# Patient Record
Sex: Female | Born: 1945
Health system: Southern US, Community
[De-identification: ages and names within clinical notes are randomized; demographics above are authoritative.]

## PROBLEM LIST (undated history)

## (undated) DIAGNOSIS — E785 Hyperlipidemia, unspecified: Secondary | ICD-10-CM

## (undated) DIAGNOSIS — B019 Varicella without complication: Secondary | ICD-10-CM

## (undated) DIAGNOSIS — N952 Postmenopausal atrophic vaginitis: Secondary | ICD-10-CM

## (undated) HISTORY — DX: Postmenopausal atrophic vaginitis: N95.2

## (undated) HISTORY — DX: Hyperlipidemia, unspecified: E78.5

## (undated) HISTORY — DX: Varicella without complication: B01.9

## (undated) HISTORY — PX: HYSTEROSCOPY WITH D & C: SHX1775

## (undated) HISTORY — PX: HAND SURGERY: SHX662

## (undated) HISTORY — PX: OTHER SURGICAL HISTORY: SHX169

## (undated) HISTORY — PX: AUGMENTATION MAMMAPLASTY: SUR837

## (undated) HISTORY — PX: BRAIN SURGERY: SHX531

## (undated) HISTORY — PX: EYE SURGERY: SHX253

---
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
## 2002-05-26 HISTORY — PX: AUGMENTATION MAMMAPLASTY: SUR837

## 2006-11-19 ENCOUNTER — Emergency Department: Payer: Self-pay | Admitting: Emergency Medicine

## 2007-01-07 ENCOUNTER — Ambulatory Visit: Payer: Self-pay | Admitting: Obstetrics & Gynecology

## 2008-02-18 ENCOUNTER — Ambulatory Visit: Payer: Self-pay | Admitting: Ophthalmology

## 2008-02-28 ENCOUNTER — Ambulatory Visit: Payer: Self-pay | Admitting: Ophthalmology

## 2008-05-08 ENCOUNTER — Ambulatory Visit: Payer: Self-pay | Admitting: Ophthalmology

## 2010-06-07 ENCOUNTER — Ambulatory Visit: Payer: Self-pay | Admitting: Internal Medicine

## 2011-06-09 ENCOUNTER — Ambulatory Visit: Payer: Self-pay | Admitting: Internal Medicine

## 2011-08-01 ENCOUNTER — Ambulatory Visit: Payer: Self-pay

## 2011-09-19 ENCOUNTER — Ambulatory Visit: Payer: Self-pay | Admitting: Unknown Physician Specialty

## 2011-10-17 ENCOUNTER — Ambulatory Visit: Payer: Self-pay | Admitting: Unknown Physician Specialty

## 2011-11-18 ENCOUNTER — Ambulatory Visit: Payer: Self-pay

## 2012-11-15 DIAGNOSIS — N952 Postmenopausal atrophic vaginitis: Secondary | ICD-10-CM

## 2012-11-15 HISTORY — DX: Postmenopausal atrophic vaginitis: N95.2

## 2013-07-21 ENCOUNTER — Ambulatory Visit: Payer: Self-pay | Admitting: Podiatrist

## 2013-07-25 ENCOUNTER — Ambulatory Visit (INDEPENDENT_AMBULATORY_CARE_PROVIDER_SITE_OTHER): Payer: Medicare Other

## 2013-07-25 ENCOUNTER — Ambulatory Visit (INDEPENDENT_AMBULATORY_CARE_PROVIDER_SITE_OTHER): Payer: Medicare Other | Admitting: Podiatry

## 2013-07-25 ENCOUNTER — Encounter: Payer: Self-pay | Admitting: Podiatry

## 2013-07-25 VITALS — BP 171/98 | HR 69 | Resp 16 | Ht 66.0 in | Wt 160.0 lb

## 2013-07-25 DIAGNOSIS — M79609 Pain in unspecified limb: Secondary | ICD-10-CM

## 2013-07-25 DIAGNOSIS — L02619 Cutaneous abscess of unspecified foot: Secondary | ICD-10-CM

## 2013-07-25 DIAGNOSIS — M79676 Pain in unspecified toe(s): Secondary | ICD-10-CM

## 2013-07-25 DIAGNOSIS — L03039 Cellulitis of unspecified toe: Secondary | ICD-10-CM

## 2013-07-25 DIAGNOSIS — M21619 Bunion of unspecified foot: Secondary | ICD-10-CM

## 2013-07-25 DIAGNOSIS — L600 Ingrowing nail: Secondary | ICD-10-CM

## 2013-07-25 MED ORDER — NEOMYCIN-POLYMYXIN-HC 3.5-10000-1 OT SOLN: OTIC | Status: DC

## 2013-07-25 MED ORDER — LEVOFLOXACIN 750 MG PO TABS: 750.0000 mg | ORAL_TABLET | Freq: Every day | ORAL | Status: DC

## 2013-07-25 NOTE — Progress Notes (Signed)
   Subjective:    Patient ID: Lynn Cochran, female    DOB: [birth year], 68 y.o.   MRN: [id number]  HPI Comments: Right great toe is sore and red, its been like this since the beginning of feb. Went to [facility] and they put me on doxcycline. Soaking in epsom salt , it has not changed at all      Review of Systems  All other systems reviewed and are negative.       Objective:   Physical Exam I have reviewed her past medical history medications allergies surgeries social history and review of systems. She has a Immunologist. She works for [employer] at [employer]. Pulses are strongly palpable bilateral. Neurologic sensorium is intact. He tendon reflexes are intact bilateral. Muscle strength is 5 over 5 dorsiflexors plantar flexors inverters everters all intrinsic musculature is intact. Orthopedic evaluation demonstrates mild hallux abductovalgus deformities bilateral. Flexible asymptomatic hammertoe deformities are also noted. Cutaneous evaluation demonstrates supple well hydrated cutis with exception of a proximal medial nail fold of the right hallux the cuticle is missing to the nail plate. Radiographic evaluation does not demonstrate any type of osseous abnormalities in this area.        Assessment & Plan:  Assessment: Paronychia medial hallux right. Hallux abductovalgus deformity bilateral. Flexible hammertoe deformities bilateral.  Plan: Discussed etiology pathology conservative versus surgical therapies at this point we performed a partial temporary nail avulsion with I&D of the abscess today she tolerated this procedure well after local anesthetic I also wrote her prescription for Levaquin 750 mg 1 a day and I will followup with her in one week. She will also start soaking in Epsom salts warm water daily

## 2013-07-25 NOTE — Patient Instructions (Signed)

## 2013-08-01 ENCOUNTER — Ambulatory Visit: Payer: No Typology Code available for payment source | Admitting: Podiatry

## 2013-08-03 ENCOUNTER — Ambulatory Visit (INDEPENDENT_AMBULATORY_CARE_PROVIDER_SITE_OTHER): Payer: Medicare Other | Admitting: Podiatry

## 2013-08-03 VITALS — BP 169/93 | HR 69 | Resp 16 | Ht 66.0 in | Wt 160.0 lb

## 2013-08-03 DIAGNOSIS — Z9889 Other specified postprocedural states: Secondary | ICD-10-CM

## 2013-08-03 NOTE — Progress Notes (Signed)
She presents today for followup of a matrixectomy and I in the tibial border proximal aspect of the hallux right. She states that she's been soaking in Epsom salts warm water and taking her Levaquin daily. The toe really hasn't hurt that much but it doesn't really look any better to her.  Objective: Vital signs are stable she is alert and oriented x3 pulses are palpable to the right foot. She does have erythema to the proximal portion of the tibial nail fold the surgical site appears to be healing quite nicely however she does have that proximal erythema that very well may not be infectious. He may be inflammation. Currently there is no drainage no purulence and malodor.  Assessment: Proximal erythema tibial border hallux right with little change that she's been taking antibiotics starting Saturday.  Plan: Continue soaking continue antibiotics. Next visit we will consider blood work, looking for an elevated sedimentation rate for possible osteomyelitis. And may consider putting her on Diflucan. Another set of x-rays may be taken.

## 2013-08-04 NOTE — Addendum Note (Signed)
Addended by: Tyson Dense T on: 08/04/2013 12:43 PM   Modules accepted: Level of Service

## 2013-08-17 ENCOUNTER — Ambulatory Visit (INDEPENDENT_AMBULATORY_CARE_PROVIDER_SITE_OTHER): Payer: Medicare Other

## 2013-08-17 ENCOUNTER — Ambulatory Visit (INDEPENDENT_AMBULATORY_CARE_PROVIDER_SITE_OTHER): Payer: Medicare Other | Admitting: Podiatry

## 2013-08-17 VITALS — BP 75/50 | HR 72 | Resp 16

## 2013-08-17 DIAGNOSIS — M79673 Pain in unspecified foot: Secondary | ICD-10-CM

## 2013-08-17 DIAGNOSIS — M79609 Pain in unspecified limb: Secondary | ICD-10-CM

## 2013-08-17 DIAGNOSIS — L02619 Cutaneous abscess of unspecified foot: Secondary | ICD-10-CM

## 2013-08-17 DIAGNOSIS — L03039 Cellulitis of unspecified toe: Secondary | ICD-10-CM

## 2013-08-17 MED ORDER — FLUCONAZOLE 150 MG PO TABS: ORAL_TABLET | ORAL | Status: DC

## 2013-08-17 NOTE — Progress Notes (Signed)
She presents today for followup of erythema to the proximal medial nail fold of the right hallux. She states it really don't see getting any better. She's been soaking in Epsom salts and water twice daily. She has finished up her Levaquin.  Objective: Vital signs are stable she is alert and oriented x3. Persistent erythema to the proximal medial nail fold of the right hallux. Which appears to be healing after matrixectomy. Radiographs taken today do not demonstrate any sign of osseous infection.  Assessment: Possible mycotic or yeast infection to the proximal medial nail fold.  Plan: Diclofenac 150 mg 1 by mouth daily x2 weeks. I will followup with her in 3 weeks to assess change.

## 2013-08-29 ENCOUNTER — Telehealth: Payer: Self-pay | Admitting: Podiatry

## 2013-08-29 NOTE — Telephone Encounter (Signed)
PT CALLED AND WOULD LIKE UPDATE ON INSURANCE ISSUES AND THE PAYMENTS SHE NEEDS TO MAKE

## 2013-09-07 ENCOUNTER — Ambulatory Visit (INDEPENDENT_AMBULATORY_CARE_PROVIDER_SITE_OTHER): Payer: Medicare Other | Admitting: Podiatry

## 2013-09-07 VITALS — Resp 16 | Ht 66.0 in | Wt 160.0 lb

## 2013-09-07 DIAGNOSIS — L03039 Cellulitis of unspecified toe: Principal | ICD-10-CM

## 2013-09-07 DIAGNOSIS — L02619 Cutaneous abscess of unspecified foot: Secondary | ICD-10-CM

## 2013-09-07 NOTE — Progress Notes (Signed)
She states something has been going on with his hallux right since February and it seems to be getting a little better since she started a Diflucan. She states the Achilles tendon longer hurts.  Objective: Pulses are palpable right. Hallux appears to be much decrease in edema and erythema since the last visit.  Assessment slowly healing cellulitis right foot.  Plan: Continue Diflucan for another month if this does not resolve then I would suggest a bone scan.

## 2013-09-28 ENCOUNTER — Ambulatory Visit (INDEPENDENT_AMBULATORY_CARE_PROVIDER_SITE_OTHER): Payer: Medicare Other | Admitting: Podiatry

## 2013-09-28 VITALS — BP 127/78 | HR 64 | Resp 16

## 2013-09-28 DIAGNOSIS — L03039 Cellulitis of unspecified toe: Principal | ICD-10-CM

## 2013-09-28 DIAGNOSIS — L02619 Cutaneous abscess of unspecified foot: Secondary | ICD-10-CM

## 2013-09-28 NOTE — Progress Notes (Signed)
She presents today for followup with her redness to her hallux right. She states it is no longer painful is not swollen anymore but is still tender she finished up her Diflucan.  Objective: Vital signs are stable she is alert and oriented x3. Pulses are palpable right. Mild erythema along the tibial border proximally but there is no raised area and it is not painful on palpation as it was previously.  Assessment: Well-healing paronychia hallux right.  Plan: Followup with me as needed.

## 2014-09-25 DIAGNOSIS — H6980 Other specified disorders of Eustachian tube, unspecified ear: Secondary | ICD-10-CM | POA: Diagnosis not present

## 2014-09-25 DIAGNOSIS — J301 Allergic rhinitis due to pollen: Secondary | ICD-10-CM | POA: Diagnosis not present

## 2014-09-25 DIAGNOSIS — H9042 Sensorineural hearing loss, unilateral, left ear, with unrestricted hearing on the contralateral side: Secondary | ICD-10-CM | POA: Diagnosis not present

## 2014-09-25 DIAGNOSIS — H9312 Tinnitus, left ear: Secondary | ICD-10-CM | POA: Diagnosis not present

## 2014-10-17 DIAGNOSIS — H2513 Age-related nuclear cataract, bilateral: Secondary | ICD-10-CM | POA: Diagnosis not present

## 2014-10-17 DIAGNOSIS — H5203 Hypermetropia, bilateral: Secondary | ICD-10-CM | POA: Diagnosis not present

## 2014-10-17 DIAGNOSIS — H524 Presbyopia: Secondary | ICD-10-CM | POA: Diagnosis not present

## 2014-10-17 DIAGNOSIS — H52223 Regular astigmatism, bilateral: Secondary | ICD-10-CM | POA: Diagnosis not present

## 2014-10-30 LAB — HM MAMMOGRAPHY: HM MAMMO: NEGATIVE

## 2014-11-20 LAB — HM PAP SMEAR: HM PAP: NORMAL

## 2015-03-02 ENCOUNTER — Ambulatory Visit (INDEPENDENT_AMBULATORY_CARE_PROVIDER_SITE_OTHER): Payer: Medicare Other | Admitting: Nurse Practitioner

## 2015-03-02 ENCOUNTER — Encounter: Payer: Self-pay | Admitting: Nurse Practitioner

## 2015-03-02 VITALS — BP 122/70 | HR 59 | Temp 98.2°F | Resp 14 | Ht 66.0 in | Wt 173.2 lb

## 2015-03-02 DIAGNOSIS — Z1239 Encounter for other screening for malignant neoplasm of breast: Secondary | ICD-10-CM | POA: Insufficient documentation

## 2015-03-02 DIAGNOSIS — Z23 Encounter for immunization: Secondary | ICD-10-CM

## 2015-03-02 DIAGNOSIS — Z7189 Other specified counseling: Secondary | ICD-10-CM

## 2015-03-02 DIAGNOSIS — E785 Hyperlipidemia, unspecified: Secondary | ICD-10-CM | POA: Diagnosis not present

## 2015-03-02 DIAGNOSIS — E663 Overweight: Secondary | ICD-10-CM

## 2015-03-02 DIAGNOSIS — Z7689 Persons encountering health services in other specified circumstances: Secondary | ICD-10-CM | POA: Insufficient documentation

## 2015-03-02 LAB — LIPID PANEL
CHOL/HDL RATIO: 5
Cholesterol: 258 mg/dL — ABNORMAL HIGH (ref 0–200)
HDL: 54.2 mg/dL (ref >39.00)
LDL Cholesterol: 166 mg/dL — ABNORMAL HIGH (ref 0–99)
NONHDL: 203.32
Triglycerides: 186 mg/dL — ABNORMAL HIGH (ref 0.0–149.0)
VLDL: 37.2 mg/dL (ref 0.0–40.0)

## 2015-03-02 LAB — HEMOGLOBIN A1C: HEMOGLOBIN A1C: 5.3 % (ref 4.6–6.5)

## 2015-03-02 NOTE — Assessment & Plan Note (Signed)
Placed order for mammogram today

## 2015-03-02 NOTE — Assessment & Plan Note (Addendum)
Discussed acute and chronic issues. Reviewed health maintenance measures, PFSHx, and immunizations. Obtain records from previous facility.  No lab work in one year will obtain A1C and lipid panel today

## 2015-03-02 NOTE — Progress Notes (Signed)
Patient ID: Lynn Cochran, female    DOB: May 22, 1946  Age: 69 y.o. MRN: [id number]  CC: Establish Care   HPI Lynn Cochran presents for establishing care and CC of weight gain.    1) New Pt info:   Immunizations- Flu- today; Tdap 2010, no pna vaccines   Mammogram- 2015 normal per pt  Pap- Pelvic with Elmo Putt [facility] [id number]   Bone Density- 12 years ago, normal exam   Colonoscopy- 2013, good for 10 years   Eye Exam- August 2016, glasses  Dental Exam- UTD  2) Chronic Problems-  Overweight- diet- healthy diet    Exercise- 2 miles daily walking  Allergies- Takes OTC medications  HLD- Statins- Liver enzyme elevation  Fibrates- muscle pain  Takes Omegas   1 Fall in August after tripping, bruising. She was leaving [employer]. She was evaluated for fractures and other concerns and was cleared. She will start back to the [facility] in Dec.   History Tamaira has a past medical history of Chicken pox.   She has past surgical history that includes Hand surgery; Appendectomy (1960); Hand surgery; and Brain surgery.   Her family history includes Alcohol abuse in her mother; Heart disease in her mother; Sudden death (age of onset: 31) in her father.She reports that she has never smoked. She has quit using smokeless tobacco. She reports that she drinks alcohol. She reports that she does not use illicit drugs.  Outpatient Prescriptions Prior to Visit  Medication Sig Dispense Refill  . fluconazole (DIFLUCAN) 150 MG tablet Take one tablet by mouth each day until gone. (Patient not taking: Reported on 03/02/2015) 14 tablet 1  . neomycin-polymyxin-hydrocortisone (CORTISPORIN) otic solution 1-2 drops to the toe after soaking twice daily 10 mL 1   No facility-administered medications prior to visit.    ROS Review of Systems  Constitutional: Negative for fever, chills, diaphoresis and fatigue.  HENT: Negative for tinnitus and trouble swallowing.   Eyes: Negative for visual disturbance.   Respiratory: Negative for chest tightness, shortness of breath and wheezing.   Cardiovascular: Negative for chest pain, palpitations and leg swelling.  Gastrointestinal: Negative for nausea, vomiting, diarrhea and constipation.  Genitourinary: Negative for difficulty urinating.  Musculoskeletal: Negative for back pain and neck pain.  Skin: Negative for rash.  Neurological: Negative for dizziness, weakness, numbness and headaches.  Hematological: Does not bruise/bleed easily.  Psychiatric/Behavioral: Negative for suicidal ideas and sleep disturbance. The patient is not nervous/anxious.    Objective:  BP 122/70 mmHg  Pulse 59  Temp(Src) 98.2 F (36.8 C)  Resp 14  Ht 5\' 6"  (1.676 m)  Wt 173 lb 3.2 oz (78.563 kg)  BMI 27.97 kg/m2  SpO2 98%  Physical Exam  Constitutional: She is oriented to person, place, and time. She appears well-developed and well-nourished. No distress.  HENT:  Head: Normocephalic and atraumatic.  Right Ear: External ear normal.  Left Ear: External ear normal.  Cardiovascular: Normal rate, regular rhythm and normal heart sounds.  Exam reveals no gallop and no friction rub.   No murmur heard. Pulmonary/Chest: Effort normal and breath sounds normal. No respiratory distress. She has no wheezes. She has no rales. She exhibits no tenderness.  Neurological: She is alert and oriented to person, place, and time. No cranial nerve deficit. She exhibits normal muscle tone. Coordination normal.  Skin: Skin is warm and dry. No rash noted. She is not diaphoretic.  Psychiatric: She has a normal mood and affect. Her behavior is normal. Judgment and thought content normal.  Assessment & Plan:   Shelley was seen today for establish care.  Diagnoses and all orders for this visit:  Encounter to establish care  Overweight -     HgB A1c -     Lipid Profile  Screening for breast cancer -     MM Digital Screening; Future  Encounter for immunization  Other orders -      Flu Vaccine QUAD 36+ mos IM   I have discontinued Ms. Vitug's neomycin-polymyxin-hydrocortisone and fluconazole. I am also having her maintain her Cholecalciferol (VITAMIN D-3 PO), co-enzyme Q-10, and loratadine.  Meds ordered this encounter  Medications  . Cholecalciferol (VITAMIN D-3 PO)    Sig: Take by mouth daily.  Marland Kitchen co-enzyme Q-10 50 MG capsule    Sig: Take 50 mg by mouth daily.  Marland Kitchen loratadine (CLARITIN) 10 MG tablet    Sig: Take 10 mg by mouth as needed for allergies.     Follow-up: Return in about 6 months (around 08/31/2015).

## 2015-03-02 NOTE — Assessment & Plan Note (Signed)
Flu vaccine given today. 

## 2015-03-02 NOTE — Assessment & Plan Note (Signed)
Patient reports she's had trouble statins in the past and fibroids she is taking omega-3 supplementation. Will obtain updated lipid panel today

## 2015-03-02 NOTE — Assessment & Plan Note (Signed)
Patient is doing walking and eating a healthy diet and reports no loss in weight. She reports her scale was 160 pounds and has been at this for many months. She is frustrated because she feels she should be losing weight. She does not wish to take medications for this she wished to do any excessive exercising or fad diets. Asked patient to monitor portions and to try planks to help her midsection. Will follow

## 2015-03-02 NOTE — Progress Notes (Signed)
Pre visit review using our clinic review tool, if applicable. No additional management support is needed unless otherwise documented below in the visit note. 

## 2015-03-02 NOTE — Patient Instructions (Signed)
Welcome to [facility]! Nice to meet you.   Look into: Prevnar- Pneumonia vaccination (part 1 of 2) and pneumovax (part 2 of 2).   Plank exercises for core strengthening.   Thank you for getting your flu vaccine today and please visit the lab before leaving.

## 2015-03-30 ENCOUNTER — Other Ambulatory Visit: Payer: Self-pay | Admitting: Nurse Practitioner

## 2015-03-30 ENCOUNTER — Ambulatory Visit
Admission: RE | Admit: 2015-03-30 | Discharge: 2015-03-30 | Disposition: A | Discharge status: Home / Self Care | Payer: Medicare Other | Source: Ambulatory Visit | Attending: Nurse Practitioner | Admitting: Nurse Practitioner

## 2015-03-30 DIAGNOSIS — Z1231 Encounter for screening mammogram for malignant neoplasm of breast: Secondary | ICD-10-CM | POA: Insufficient documentation

## 2015-03-30 DIAGNOSIS — H6983 Other specified disorders of Eustachian tube, bilateral: Secondary | ICD-10-CM | POA: Diagnosis not present

## 2015-03-30 DIAGNOSIS — Z1239 Encounter for other screening for malignant neoplasm of breast: Secondary | ICD-10-CM

## 2015-03-30 DIAGNOSIS — H9042 Sensorineural hearing loss, unilateral, left ear, with unrestricted hearing on the contralateral side: Secondary | ICD-10-CM | POA: Diagnosis not present

## 2015-04-25 DIAGNOSIS — Z1283 Encounter for screening for malignant neoplasm of skin: Secondary | ICD-10-CM | POA: Diagnosis not present

## 2015-04-25 DIAGNOSIS — L570 Actinic keratosis: Secondary | ICD-10-CM | POA: Diagnosis not present

## 2015-10-31 DIAGNOSIS — S20462A Insect bite (nonvenomous) of left back wall of thorax, initial encounter: Secondary | ICD-10-CM | POA: Diagnosis not present

## 2016-01-24 DIAGNOSIS — H524 Presbyopia: Secondary | ICD-10-CM | POA: Diagnosis not present

## 2016-01-24 DIAGNOSIS — H25013 Cortical age-related cataract, bilateral: Secondary | ICD-10-CM | POA: Diagnosis not present

## 2016-01-24 DIAGNOSIS — H2513 Age-related nuclear cataract, bilateral: Secondary | ICD-10-CM | POA: Diagnosis not present

## 2016-01-24 DIAGNOSIS — H5203 Hypermetropia, bilateral: Secondary | ICD-10-CM | POA: Diagnosis not present

## 2016-02-13 ENCOUNTER — Ambulatory Visit: Payer: No Typology Code available for payment source

## 2016-02-16 ENCOUNTER — Ambulatory Visit: Payer: No Typology Code available for payment source

## 2016-02-18 ENCOUNTER — Encounter (INDEPENDENT_AMBULATORY_CARE_PROVIDER_SITE_OTHER): Payer: Self-pay

## 2016-02-18 ENCOUNTER — Ambulatory Visit (INDEPENDENT_AMBULATORY_CARE_PROVIDER_SITE_OTHER): Payer: Medicare Other

## 2016-02-18 DIAGNOSIS — Z23 Encounter for immunization: Secondary | ICD-10-CM

## 2016-02-18 NOTE — Progress Notes (Signed)
Flu shot, high dose

## 2016-02-19 ENCOUNTER — Ambulatory Visit: Payer: No Typology Code available for payment source

## 2016-03-24 ENCOUNTER — Encounter: Payer: No Typology Code available for payment source | Admitting: Family

## 2016-03-26 DIAGNOSIS — H2513 Age-related nuclear cataract, bilateral: Secondary | ICD-10-CM | POA: Diagnosis not present

## 2016-03-26 DIAGNOSIS — H25013 Cortical age-related cataract, bilateral: Secondary | ICD-10-CM | POA: Diagnosis not present

## 2016-03-26 DIAGNOSIS — H532 Diplopia: Secondary | ICD-10-CM | POA: Diagnosis not present

## 2016-03-29 ENCOUNTER — Encounter: Payer: Self-pay | Admitting: Family

## 2016-03-31 ENCOUNTER — Other Ambulatory Visit: Payer: Self-pay | Admitting: Obstetrics and Gynecology

## 2016-03-31 ENCOUNTER — Telehealth: Payer: Self-pay | Admitting: Family

## 2016-03-31 DIAGNOSIS — Z01419 Encounter for gynecological examination (general) (routine) without abnormal findings: Secondary | ICD-10-CM | POA: Diagnosis not present

## 2016-03-31 DIAGNOSIS — Z1239 Encounter for other screening for malignant neoplasm of breast: Secondary | ICD-10-CM | POA: Diagnosis not present

## 2016-03-31 DIAGNOSIS — N952 Postmenopausal atrophic vaginitis: Secondary | ICD-10-CM | POA: Diagnosis not present

## 2016-03-31 DIAGNOSIS — Z13820 Encounter for screening for osteoporosis: Secondary | ICD-10-CM

## 2016-03-31 DIAGNOSIS — Z124 Encounter for screening for malignant neoplasm of cervix: Secondary | ICD-10-CM | POA: Diagnosis not present

## 2016-03-31 DIAGNOSIS — Z1231 Encounter for screening mammogram for malignant neoplasm of breast: Secondary | ICD-10-CM

## 2016-04-01 ENCOUNTER — Ambulatory Visit (INDEPENDENT_AMBULATORY_CARE_PROVIDER_SITE_OTHER): Payer: Medicare Other | Admitting: Family

## 2016-04-01 ENCOUNTER — Encounter: Payer: Self-pay | Admitting: Family

## 2016-04-01 VITALS — BP 118/80 | HR 71 | Temp 98.3°F | Ht 66.0 in | Wt 173.0 lb

## 2016-04-01 DIAGNOSIS — Z7689 Persons encountering health services in other specified circumstances: Secondary | ICD-10-CM | POA: Diagnosis not present

## 2016-04-01 DIAGNOSIS — H532 Diplopia: Secondary | ICD-10-CM

## 2016-04-01 LAB — LIPID PANEL
CHOL/HDL RATIO: 5
Cholesterol: 280 mg/dL — ABNORMAL HIGH (ref 0–200)
HDL: 53.8 mg/dL (ref >39.00)
LDL Cholesterol: 198 mg/dL — ABNORMAL HIGH (ref 0–99)
NONHDL: 226.17
Triglycerides: 141 mg/dL (ref 0.0–149.0)
VLDL: 28.2 mg/dL (ref 0.0–40.0)

## 2016-04-01 LAB — HEMOGLOBIN A1C: Hgb A1c MFr Bld: 5.3 % (ref 4.6–6.5)

## 2016-04-01 LAB — CBC WITH DIFFERENTIAL/PLATELET
BASOS ABS: 0 10*3/uL (ref 0.0–0.1)
Basophils Relative: 0.4 % (ref 0.0–3.0)
EOS ABS: 0.1 10*3/uL (ref 0.0–0.7)
Eosinophils Relative: 0.8 % (ref 0.0–5.0)
HEMATOCRIT: 44.6 % (ref 36.0–46.0)
Hemoglobin: 15.2 g/dL — ABNORMAL HIGH (ref 12.0–15.0)
LYMPHS ABS: 1.8 10*3/uL (ref 0.7–4.0)
LYMPHS PCT: 24.8 % (ref 12.0–46.0)
MCHC: 34.2 g/dL (ref 30.0–36.0)
MCV: 90.4 fl (ref 78.0–100.0)
MONOS PCT: 5.2 % (ref 3.0–12.0)
Monocytes Absolute: 0.4 10*3/uL (ref 0.1–1.0)
NEUTROS ABS: 4.9 10*3/uL (ref 1.4–7.7)
NEUTROS PCT: 68.8 % (ref 43.0–77.0)
PLATELETS: 326 10*3/uL (ref 150.0–400.0)
RBC: 4.94 Mil/uL (ref 3.87–5.11)
RDW: 13.6 % (ref 11.5–15.5)
WBC: 7.2 10*3/uL (ref 4.0–10.5)

## 2016-04-01 LAB — COMPREHENSIVE METABOLIC PANEL
ALK PHOS: 93 U/L (ref 39–117)
ALT: 20 U/L (ref 0–35)
AST: 18 U/L (ref 0–37)
Albumin: 4.9 g/dL (ref 3.5–5.2)
BILIRUBIN TOTAL: 0.7 mg/dL (ref 0.2–1.2)
BUN: 16 mg/dL (ref 6–23)
CO2: 28 meq/L (ref 19–32)
CREATININE: 0.79 mg/dL (ref 0.40–1.20)
Calcium: 10.3 mg/dL (ref 8.4–10.5)
Chloride: 106 mEq/L (ref 96–112)
GFR: 76.67 mL/min (ref >60.00)
GLUCOSE: 97 mg/dL (ref 70–99)
Potassium: 4 mEq/L (ref 3.5–5.1)
Sodium: 142 mEq/L (ref 135–145)
TOTAL PROTEIN: 7.7 g/dL (ref 6.0–8.3)

## 2016-04-01 LAB — TSH: TSH: 2.17 u[IU]/mL (ref 0.35–4.50)

## 2016-04-01 LAB — VITAMIN D 25 HYDROXY (VIT D DEFICIENCY, FRACTURES): VITD: 33.73 ng/mL (ref 30.00–100.00)

## 2016-04-01 MED ORDER — PNEUMOCOCCAL 13-VAL CONJ VACC IM SUSP
0.5000 mL | INTRAMUSCULAR | Status: AC
  Administered 2016-04-01: 0.5 mL via INTRAMUSCULAR

## 2016-04-01 NOTE — Progress Notes (Signed)
Pre visit review using our clinic review tool, if applicable. No additional management support is needed unless otherwise documented below in the visit note. 

## 2016-04-01 NOTE — Assessment & Plan Note (Signed)
Chronic. No symptoms today. I'm very reassured by normal neurologic exam and lack of progression of diplopia over the last several years. Pending MRI as requested.

## 2016-04-01 NOTE — Patient Instructions (Signed)
MRI Brain  F/u CPE  If there is no improvement in your symptoms, or if there is any worsening of symptoms, or if you have any additional concerns, please return for re-evaluation; or, if we are closed, consider going to the Emergency Room for evaluation if symptoms urgent.

## 2016-04-01 NOTE — Progress Notes (Signed)
Subjective:    Patient ID: Lynn Cochran, female    DOB: 1945/09/17, 70 y.o.   MRN: [id number]  CC: Lynn Cochran is a 70 y.o. female who presents today for physical exam.    HPI: Here for follow up, establish care, and primarily to request for MRI head for chronic intermittent double vision for past 9 years.  Notes primarily early in the morning or late in the day. Notices it with driving. Improves with covers one eye. Unchanged.   2008 seen by ophthalmologist for double vision. CT [place] 2009 was normal. Tested for myasthenia gravis per patient which was normal. No MRI done at that time. Planning ocular surgery however unsure if can be done because of double vision which is impacting QOL. Appointment with strabibus doctor in 04/2016 and would like MRI prior too.   HLD- Following low trans fat diet. Statins raised liver enzymes.   She will return for CPE. Fasting labs done today.        HISTORY:  Past Medical History:  Diagnosis Date  . Chicken pox     Past Surgical History:  Procedure Laterality Date  . APPENDECTOMY  1960  . AUGMENTATION MAMMAPLASTY Bilateral 1980's   implants  . AUGMENTATION MAMMAPLASTY Bilateral 2004   implants removed and mastopexi  . BRAIN SURGERY     Mastopexy  . HAND SURGERY    . HAND SURGERY     Hemangioma Left Palm   Family History  Problem Relation Age of Onset  . Heart disease Mother   . Alcohol abuse Mother   . Sudden death Father 63    Car Accident  . Brain cancer Sister     glioblastoma  . Lung cancer Sister       ALLERGIES: Patient has no known allergies.  Current Outpatient Prescriptions on File Prior to Visit  Medication Sig Dispense Refill  . Cholecalciferol (VITAMIN D-3 PO) Take by mouth daily.    Marland Kitchen co-enzyme Q-10 50 MG capsule Take 50 mg by mouth daily.    Marland Kitchen loratadine (CLARITIN) 10 MG tablet Take 10 mg by mouth as needed for allergies.     No current facility-administered medications on file prior to  visit.     Social History  Substance Use Topics  . Smoking status: Never Smoker  . Smokeless tobacco: Former Systems developer  . Alcohol use 0.0 oz/week     Comment: Socially     Review of Systems  Constitutional: Negative for chills, fever and unexpected weight change.  HENT: Negative for congestion.   Respiratory: Negative for cough.   Cardiovascular: Negative for chest pain, palpitations and leg swelling.  Gastrointestinal: Negative for nausea and vomiting.  Musculoskeletal: Negative for arthralgias and myalgias.  Skin: Negative for rash.  Neurological: Negative for headaches.  Hematological: Negative for adenopathy.  Psychiatric/Behavioral: Negative for confusion.      Objective:    BP 118/80   Pulse 71   Temp 98.3 F (36.8 C) (Oral)   Ht 5\' 6"  (1.676 m)   Wt 173 lb (78.5 kg)   SpO2 97%   BMI 27.92 kg/m   BP Readings from Last 3 Encounters:  04/01/16 118/80  03/02/15 122/70  09/28/13 127/78   Wt Readings from Last 3 Encounters:  04/01/16 173 lb (78.5 kg)  03/02/15 173 lb 3.2 oz (78.6 kg)  09/07/13 160 lb (72.6 kg)    Physical Exam  Constitutional: She appears well-developed and well-nourished.  HENT:  Mouth/Throat: Uvula is midline, oropharynx is clear  and moist and mucous membranes are normal.  Eyes: Conjunctivae and EOM are normal. Pupils are equal, round, and reactive to light.  Fundus normal bilaterally.   Neck: No thyroid mass and no thyromegaly present.  Cardiovascular: Normal rate, regular rhythm, normal heart sounds and normal pulses.   Pulmonary/Chest: Effort normal. She has no decreased breath sounds. She has no wheezes. She has no rhonchi. She has no rales.  Neurological: She is alert. She has normal strength. No cranial nerve deficit or sensory deficit. She displays a negative Romberg sign.  Reflex Scores:      Bicep reflexes are 2+ on the right side and 2+ on the left side.      Patellar reflexes are 2+ on the right side and 2+ on the left side. Grip  equal and strong bilateral upper extremities. Gait strong and steady. Able to perform rapid alternating movement without difficulty.   Skin: Skin is warm and dry.  Psychiatric: She has a normal mood and affect. Her speech is normal and behavior is normal. Thought content normal.  Vitals reviewed.      Assessment & Plan:   Problem List Items Addressed This Visit      Other   Encounter to establish care    Cochran reviewed past medical social, family, medical history with patient. She will return for CPE. Screening labs today.       Relevant Medications   pneumococcal 13-valent conjugate vaccine (PREVNAR 13) injection 0.5 mL (Start on 04/02/2016 10:00 AM)   Other Relevant Orders   CBC with Differential/Platelet   Comprehensive metabolic panel   Hepatitis C antibody   Lipid panel   Hemoglobin A1c   TSH   VITAMIN D 25 Hydroxy (Vit-D Deficiency, Fractures)   Double vision - Primary    Chronic. No symptoms today. Cochran'm very reassured by normal neurologic exam and lack of progression of diplopia over the last several years. Pending MRI as requested.       Relevant Orders   MR BRAIN W WO CONTRAST       Cochran am having Lynn Cochran maintain her Cholecalciferol (VITAMIN D-3 PO), co-enzyme Q-10, and loratadine. We will continue to administer pneumococcal 13-valent conjugate vaccine.   Meds ordered this encounter  Medications  . pneumococcal 13-valent conjugate vaccine (PREVNAR 13) injection 0.5 mL    Return precautions given.   Risks, benefits, and alternatives of the medications and treatment plan prescribed today were discussed, and patient expressed understanding.   Education regarding symptom management and diagnosis given to patient on AVS.   Continue to follow with Lynn Cochran for routine health maintenance.   Lynn Cochran agreed with plan.   Lynn Cochran

## 2016-04-01 NOTE — Assessment & Plan Note (Addendum)
I reviewed past medical social, family, medical history with patient. She will return for CPE. Screening labs today.

## 2016-04-02 LAB — HEPATITIS C ANTIBODY: HCV AB: NEGATIVE

## 2016-04-03 ENCOUNTER — Ambulatory Visit
Admission: RE | Admit: 2016-04-03 | Discharge: 2016-04-03 | Disposition: A | Discharge status: Home / Self Care | Payer: Medicare Other | Source: Ambulatory Visit | Attending: Family | Admitting: Family

## 2016-04-03 DIAGNOSIS — H532 Diplopia: Secondary | ICD-10-CM | POA: Diagnosis not present

## 2016-04-03 DIAGNOSIS — R9082 White matter disease, unspecified: Secondary | ICD-10-CM | POA: Insufficient documentation

## 2016-04-03 MED ORDER — GADOBENATE DIMEGLUMINE 529 MG/ML IV SOLN
20.0000 mL | Freq: Once | INTRAVENOUS | Status: AC | PRN
  Administered 2016-04-03: 16 mL via INTRAVENOUS

## 2016-04-07 ENCOUNTER — Encounter: Payer: Self-pay | Admitting: Family

## 2016-04-10 ENCOUNTER — Telehealth: Payer: Self-pay | Admitting: Family

## 2016-04-10 NOTE — Telephone Encounter (Signed)
Reviewed MRI.

## 2016-04-10 NOTE — Telephone Encounter (Signed)
Reviewed MRI. Advised patient to pick up CD at [facility]. She verbalized understanding.

## 2016-04-10 NOTE — Telephone Encounter (Signed)
done

## 2016-04-10 NOTE — Telephone Encounter (Signed)
Medicare and [facility] will not pay for CPE.  She will be made responsible, so she can have CPE if she wants to pay out of pocket.

## 2016-04-15 DIAGNOSIS — Z872 Personal history of diseases of the skin and subcutaneous tissue: Secondary | ICD-10-CM | POA: Diagnosis not present

## 2016-04-15 DIAGNOSIS — Z1283 Encounter for screening for malignant neoplasm of skin: Secondary | ICD-10-CM | POA: Diagnosis not present

## 2016-04-15 DIAGNOSIS — D485 Neoplasm of uncertain behavior of skin: Secondary | ICD-10-CM | POA: Diagnosis not present

## 2016-04-15 DIAGNOSIS — Z09 Encounter for follow-up examination after completed treatment for conditions other than malignant neoplasm: Secondary | ICD-10-CM | POA: Diagnosis not present

## 2016-04-15 DIAGNOSIS — L570 Actinic keratosis: Secondary | ICD-10-CM | POA: Diagnosis not present

## 2016-04-22 IMAGING — MG MM DIGITAL SCREENING BILAT W/ CAD
4 series · 4 of 4 positions shown · non-contrast
Comparison: Previous exam(s).

ACR Breast Density Category a: The breast tissue is almost entirely
fatty.

CLINICAL DATA: Screening.

EXAM:
DIGITAL SCREENING BILATERAL MAMMOGRAM WITH CAD

[L CC]
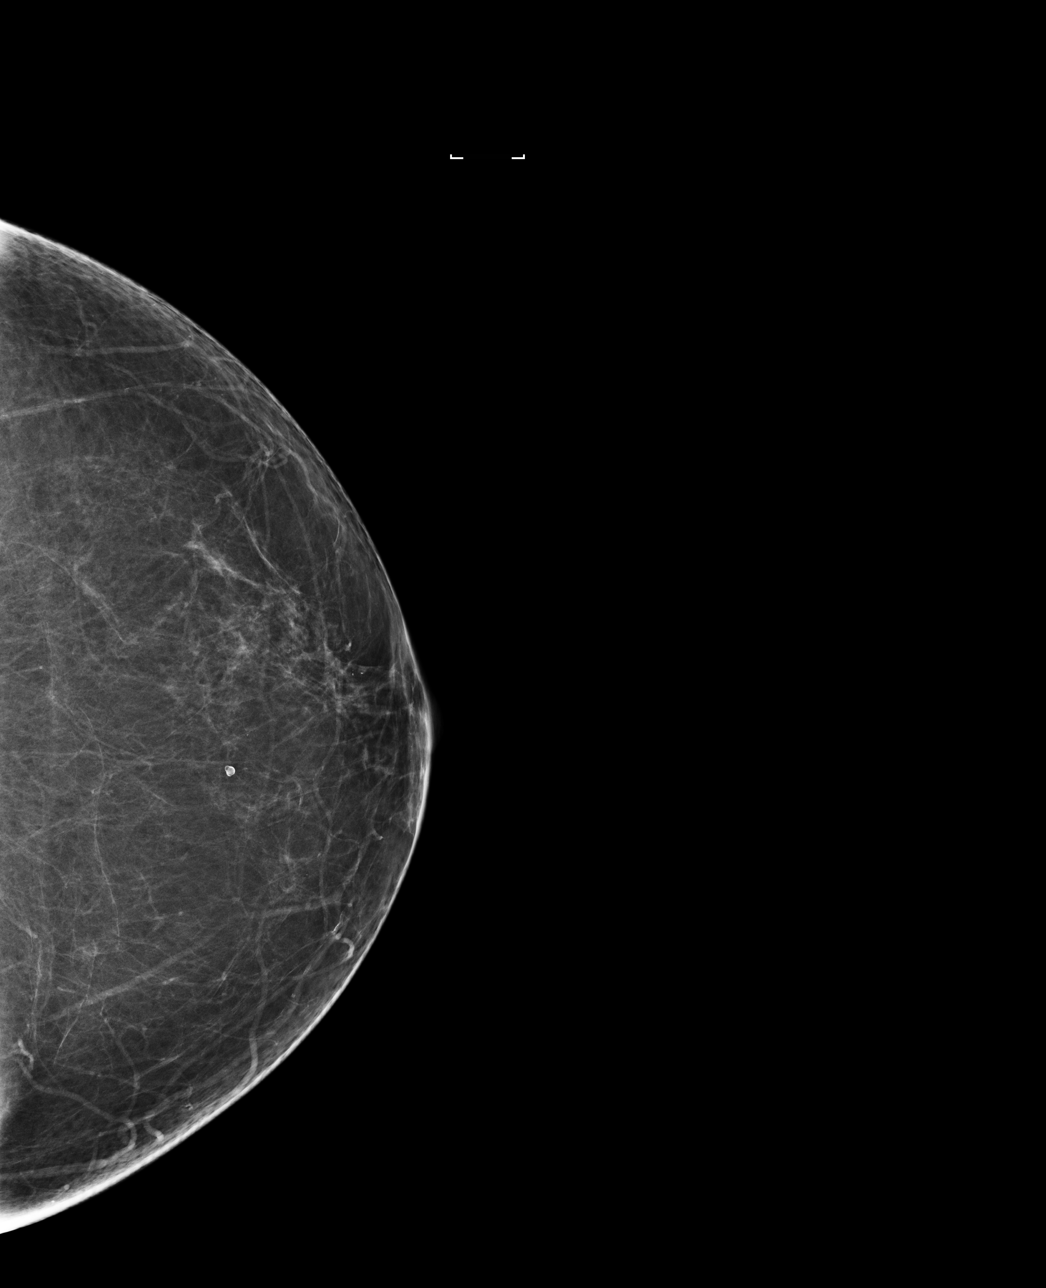

[R CC]
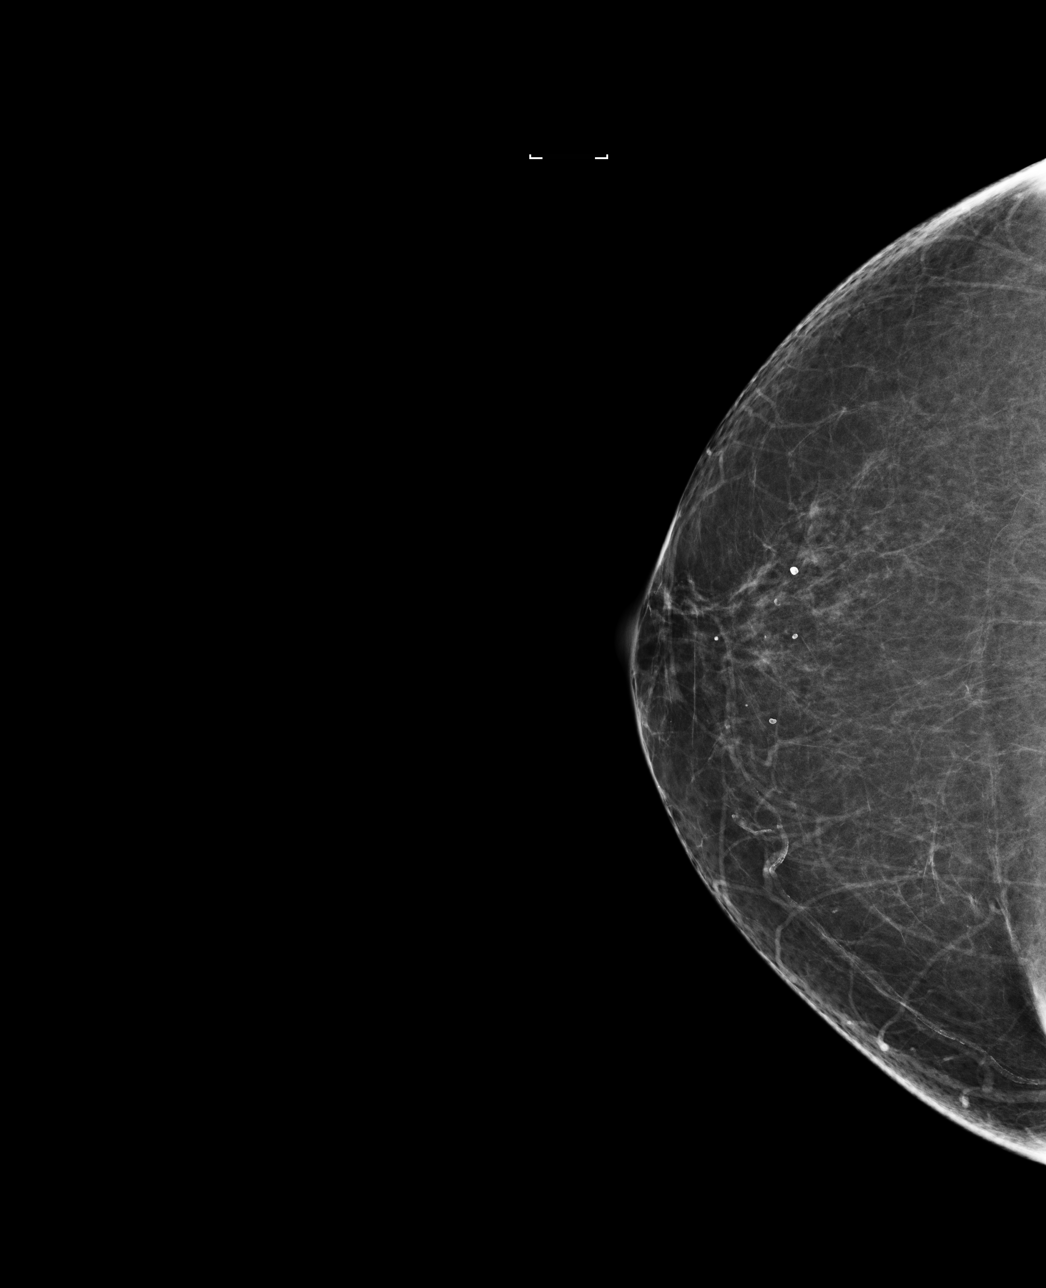

[L MLO]
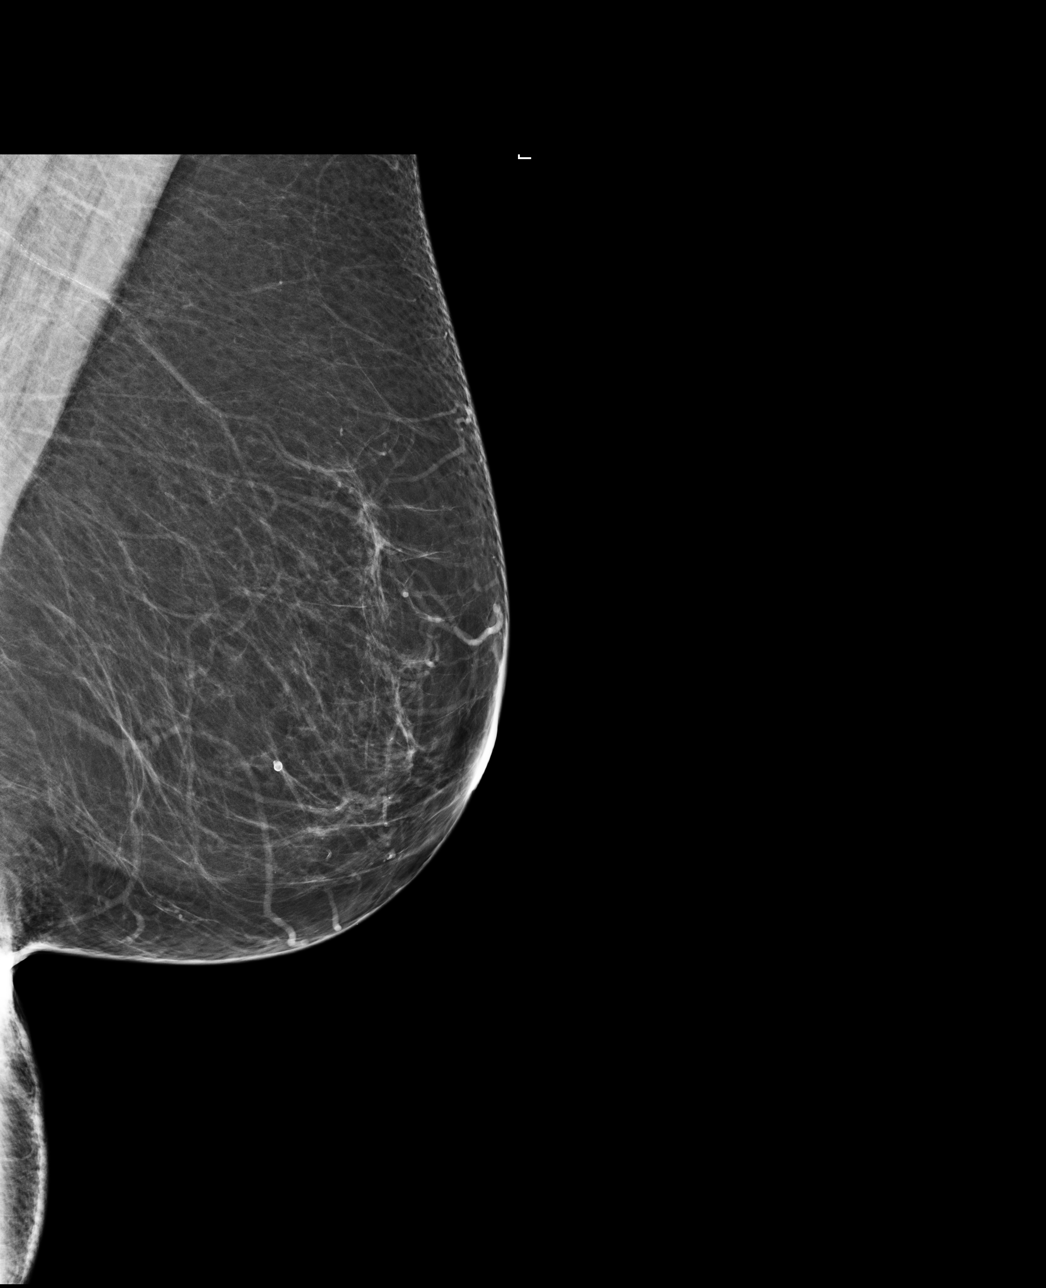

[R MLO]
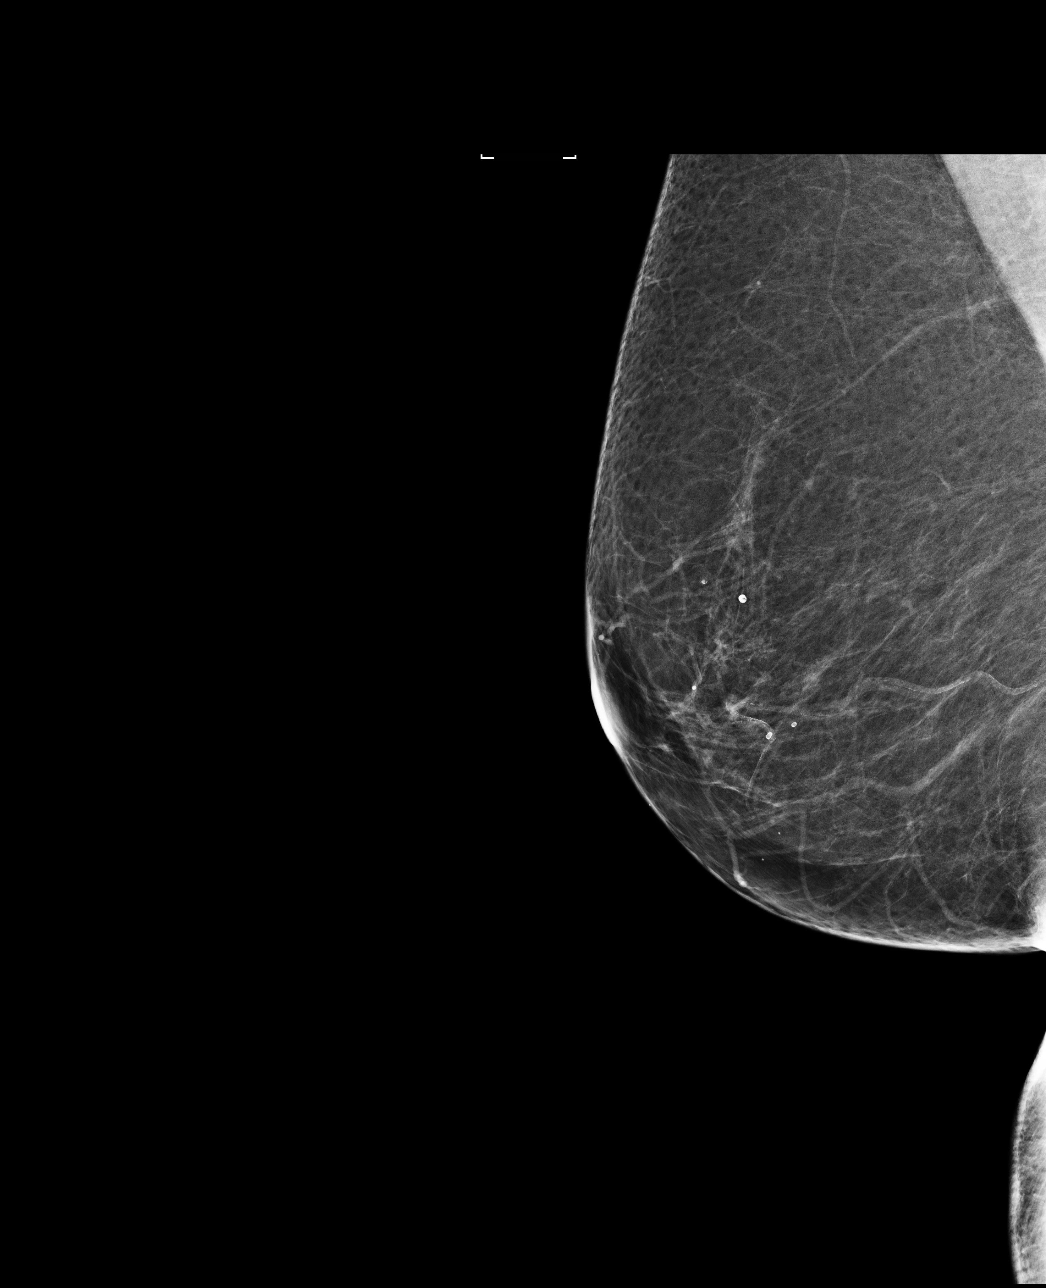

[4 of 4 positions shown; findings below may reference images not displayed]

FINDINGS: There are no findings suspicious for malignancy. Images were
processed with CAD.
IMPRESSION: No mammographic evidence of malignancy. A result letter of this
screening mammogram will be mailed directly to the patient.

RECOMMENDATION:
Screening mammogram in one year. (Code:[id number])

BI-RADS CATEGORY  1: Negative.

## 2016-05-01 DIAGNOSIS — H5203 Hypermetropia, bilateral: Secondary | ICD-10-CM | POA: Diagnosis not present

## 2016-05-01 DIAGNOSIS — H5032 Intermittent alternating esotropia: Secondary | ICD-10-CM | POA: Diagnosis not present

## 2016-05-13 ENCOUNTER — Ambulatory Visit
Admission: RE | Admit: 2016-05-13 | Discharge: 2016-05-13 | Disposition: A | Discharge status: Home / Self Care | Payer: Medicare Other | Source: Ambulatory Visit | Attending: Obstetrics and Gynecology | Admitting: Obstetrics and Gynecology

## 2016-05-13 DIAGNOSIS — Z1231 Encounter for screening mammogram for malignant neoplasm of breast: Secondary | ICD-10-CM | POA: Diagnosis not present

## 2016-05-13 DIAGNOSIS — Z13820 Encounter for screening for osteoporosis: Secondary | ICD-10-CM | POA: Diagnosis not present

## 2016-05-13 DIAGNOSIS — Z780 Asymptomatic menopausal state: Secondary | ICD-10-CM | POA: Diagnosis not present

## 2017-01-29 DIAGNOSIS — H903 Sensorineural hearing loss, bilateral: Secondary | ICD-10-CM | POA: Diagnosis not present

## 2017-01-30 ENCOUNTER — Other Ambulatory Visit: Payer: Self-pay | Admitting: Otolaryngology

## 2017-01-30 DIAGNOSIS — H90A12 Conductive hearing loss, unilateral, left ear with restricted hearing on the contralateral side: Secondary | ICD-10-CM

## 2017-02-06 ENCOUNTER — Ambulatory Visit
Admission: RE | Admit: 2017-02-06 | Discharge: 2017-02-06 | Disposition: A | Discharge status: Home / Self Care | Payer: Medicare Other | Source: Ambulatory Visit | Attending: Otolaryngology | Admitting: Otolaryngology

## 2017-02-06 DIAGNOSIS — H9012 Conductive hearing loss, unilateral, left ear, with unrestricted hearing on the contralateral side: Secondary | ICD-10-CM | POA: Diagnosis not present

## 2017-02-06 DIAGNOSIS — H90A12 Conductive hearing loss, unilateral, left ear with restricted hearing on the contralateral side: Secondary | ICD-10-CM | POA: Insufficient documentation

## 2017-02-06 LAB — POCT I-STAT CREATININE: Creatinine, Ser: 0.8 mg/dL (ref 0.44–1.00)

## 2017-02-06 MED ORDER — GADOBENATE DIMEGLUMINE 529 MG/ML IV SOLN
15.0000 mL | Freq: Once | INTRAVENOUS | Status: AC | PRN
  Administered 2017-02-06: 15 mL via INTRAVENOUS

## 2017-03-16 ENCOUNTER — Ambulatory Visit: Payer: Medicare Other

## 2017-03-16 DIAGNOSIS — Z23 Encounter for immunization: Secondary | ICD-10-CM | POA: Diagnosis not present

## 2017-04-15 ENCOUNTER — Other Ambulatory Visit: Payer: Self-pay | Admitting: Family

## 2017-04-15 ENCOUNTER — Other Ambulatory Visit: Payer: Self-pay

## 2017-04-15 DIAGNOSIS — L570 Actinic keratosis: Secondary | ICD-10-CM | POA: Diagnosis not present

## 2017-04-15 DIAGNOSIS — E559 Vitamin D deficiency, unspecified: Secondary | ICD-10-CM

## 2017-04-15 DIAGNOSIS — R7309 Other abnormal glucose: Secondary | ICD-10-CM

## 2017-04-15 DIAGNOSIS — L578 Other skin changes due to chronic exposure to nonionizing radiation: Secondary | ICD-10-CM | POA: Diagnosis not present

## 2017-04-15 DIAGNOSIS — E663 Overweight: Secondary | ICD-10-CM

## 2017-04-15 DIAGNOSIS — Z872 Personal history of diseases of the skin and subcutaneous tissue: Secondary | ICD-10-CM | POA: Diagnosis not present

## 2017-04-15 DIAGNOSIS — E785 Hyperlipidemia, unspecified: Secondary | ICD-10-CM

## 2017-04-27 ENCOUNTER — Encounter: Payer: Self-pay | Admitting: Family

## 2017-05-18 ENCOUNTER — Other Ambulatory Visit: Payer: Self-pay

## 2017-05-18 DIAGNOSIS — Z1329 Encounter for screening for other suspected endocrine disorder: Secondary | ICD-10-CM

## 2017-05-18 DIAGNOSIS — R300 Dysuria: Secondary | ICD-10-CM

## 2017-05-20 ENCOUNTER — Ambulatory Visit (INDEPENDENT_AMBULATORY_CARE_PROVIDER_SITE_OTHER): Payer: Medicare Other

## 2017-05-20 VITALS — BP 140/88 | HR 67 | Temp 98.1°F | Resp 14 | Ht 66.0 in | Wt 176.0 lb

## 2017-05-20 DIAGNOSIS — Z23 Encounter for immunization: Secondary | ICD-10-CM | POA: Diagnosis not present

## 2017-05-20 DIAGNOSIS — Z0000 Encounter for general adult medical examination without abnormal findings: Secondary | ICD-10-CM

## 2017-05-20 NOTE — Progress Notes (Signed)
Subjective:   Lynn Cochran is a 71 y.o. female who presents for an Initial Medicare Annual Wellness Visit.  Review of Systems    No ROS.  Medicare Wellness Visit. Additional risk factors are reflected in the social history.  Cardiac Risk Factors include: advanced age (>81men, >35 women)     Objective:    Today's Vitals   05/20/17 0912  BP: 140/88  Pulse: 67  Resp: 14  Temp: 98.1 F (36.7 C)  TempSrc: Oral  SpO2: 97%  Weight: 176 lb (79.8 kg)  Height: 5\' 6"  (1.676 m)   Body mass index is 28.41 kg/m.  Advanced Directives 05/20/2017  Does Patient Have a Medical Advance Directive? Yes  Type of Advance Directive Living will;Healthcare Power of Attorney  Does patient want to make changes to medical advance directive? No - Patient declined  Copy of [place] in Chart? No - copy requested    Current Medications (verified) Outpatient Encounter Medications as of 05/20/2017  Medication Sig  . Cholecalciferol (VITAMIN D-3 PO) Take by mouth daily.  Marland Kitchen co-enzyme Q-10 50 MG capsule Take 50 mg by mouth daily.  Marland Kitchen loratadine (CLARITIN) 10 MG tablet Take 10 mg by mouth as needed for allergies.   No facility-administered encounter medications on file as of 05/20/2017.     Allergies (verified) Patient has no known allergies.   History: Past Medical History:  Diagnosis Date  . Chicken pox    Past Surgical History:  Procedure Laterality Date  . APPENDECTOMY  1960  . AUGMENTATION MAMMAPLASTY Bilateral 1980's   implants  . AUGMENTATION MAMMAPLASTY Bilateral 2004   implants removed and mastopexi  . BRAIN SURGERY     Mastopexy  . HAND SURGERY    . HAND SURGERY     Hemangioma Left Palm   Family History  Problem Relation Age of Onset  . Heart disease Mother   . Alcohol abuse Mother   . Congestive Heart Failure Mother   . Sudden death Father 77       Car Accident  . Diabetes Paternal Grandmother   . Brain cancer Sister        glioblastoma    . Lung cancer Sister    Social History   Socioeconomic History  . Marital status: Married    Spouse name: None  . Number of children: None  . Years of education: None  . Highest education level: None  Social Needs  . Financial resource strain: None  . Food insecurity - worry: None  . Food insecurity - inability: None  . Transportation needs - medical: None  . Transportation needs - non-medical: None  Occupational History  . None  Tobacco Use  . Smoking status: Never Smoker  . Smokeless tobacco: Former Network engineer and Sexual Activity  . Alcohol use: Yes    Alcohol/week: 0.0 oz    Comment: Socially   . Drug use: No  . Sexual activity: Yes    Partners: Male    Birth control/protection: Post-menopausal    Comment: Husband   Other Topics Concern  . None  Social History Narrative   Married   Retired Orthoptist- 1 dog    Highest level of education- Bachelors degree    Retired    Caffeine- Coffee 1 cup in the morning, no tea or soda     Exercises- walks daily with goldendoodle.    Tobacco Counseling Counseling given: Not Answered   Clinical Intake:  Pre-visit preparation completed: Yes  Pain : No/denies pain     Nutritional Status: BMI 25 -29 Overweight Diabetes: No  How often do you need to have someone help you when you read instructions, pamphlets, or other written materials from your doctor or pharmacy?: 1 - Never  Interpreter Needed?: No      Activities of Daily Living In your present state of health, do you have any difficulty performing the following activities: 05/20/2017  Hearing? Y  Comment Hearing aids  Vision? N  Difficulty concentrating or making decisions? N  Walking or climbing stairs? N  Dressing or bathing? N  Doing errands, shopping? N  Preparing Food and eating ? N  Using the Toilet? N  In the past six months, have you accidently leaked urine? N  Do you have problems with loss of bowel control? N  Managing  your Medications? N  Managing your Finances? N  Housekeeping or managing your Housekeeping? N  Some recent data might be hidden     Immunizations and Health Maintenance Immunization History  Administered Date(s) Administered  . Influenza, High Dose Seasonal PF 02/18/2016  . Influenza,inj,Quad PF,6+ Mos 03/02/2015  . Influenza-Unspecified 03/16/2017  . Pneumococcal Conjugate-13 04/01/2016  . Pneumococcal Polysaccharide-23 05/20/2017  . Td 10/30/2008   There are no preventive care reminders to display for this patient.  Patient Care Team: Burnard Hawthorne, FNP as PCP - General (Family Medicine)  Indicate any recent Medical Services you may have received from other than [facility] providers in the past year (date may be approximate).     Assessment:   This is a routine wellness examination for Lynn Cochran.  The goal of the wellness visit is to assist the patient how to close the gaps in care and create a preventative care plan for the patient.   The roster of all physicians providing medical care to patient is listed in the Snapshot section of the chart.  Taking calcium VIT D as appropriate/Osteoporosis risk reviewed.    Safety issues reviewed; Smoke and carbon monoxide detectors in the home. No firearms in the home.  Wears seatbelts when driving or riding with others. Patient does wear sunscreen or protective clothing when in direct sunlight. No violence in the home.  Patient is alert, normal appearance, oriented to person/place/and time. Correctly identified the president of the [place], recall of 2/3 words, and performing simple calculations. Displays appropriate judgement and can read correct time from watch face.   No new identified risk were noted.  No failures at ADL's or IADL's.    BMI- discussed the importance of a healthy diet, water intake and the benefits of aerobic exercise. Educational material provided.   24 hour diet recall: Low carb diet, 2 meals daily.  Educational  material provided to encourage a balanced healthy diet.    Daily fluid intake: 2 cups of caffeine, 6-7 cups of water.  Dental- every 6 months.  Eye- Visual acuity not assessed per patient preference since they have regular follow up with the ophthalmologist.  Wears corrective lenses.  Sleep patterns- Sleeps 4-5 hours at night.  Wakes feeling rested.   Pneumovax 23 vaccine administered L deltoid, tolerated well.  Verbalized no complaints and showed no signs of distress.  Educational material provided.  Mammogram  ordered; follow as directed.  Educational material provided.  Health maintenance gaps- closed.  Fasting labs ordered; schedule an appointment prior to next appointment with PCP.  Patient Concerns: None at this time. Follow up with PCP as needed.  Hearing/Vision screen Hearing Screening Comments: Hearing aid, bilateral Vision Screening Comments: Followed by [facility] Francee Gentile) Wears corrective lenses Last OV 11/2016 Cataract extraction, bilateral  Visual acuity not assessed per patient preference since they have regular follow up with the ophthalmologist  Dietary issues and exercise activities discussed: Current Exercise Habits: Home exercise routine, Type of exercise: walking, Time (Minutes): 45, Frequency (Times/Week): 7, Weekly Exercise (Minutes/Week): 315, Intensity: Mild  Goals    . Increase physical activity     Zumba and swim for exercise       Depression Screen PHQ 2/9 Scores 05/20/2017 03/02/2015  PHQ - 2 Score 0 0    Fall Risk Fall Risk  05/20/2017 03/02/2015  Falls in the past year? No Yes  Number falls in past yr: - 1  Injury with Fall? - Yes  Risk Factor Category  - High Fall Risk  Follow up - Falls prevention discussed    Cognitive Function: MMSE - Mini Mental State Exam 05/20/2017  Not completed: Refused        Screening Tests Health Maintenance  Topic Date Due  . MAMMOGRAM  05/13/2018  . TETANUS/TDAP  10/31/2018  .  COLONOSCOPY  05/26/2021  . INFLUENZA VACCINE  Completed  . DEXA SCAN  Completed  . Hepatitis C Screening  Completed  . PNA vac Low Risk Adult  Completed      Plan:    End of life planning; Advance aging; Advanced directives discussed. Copy of current HCPOA/Living Will requested.    I have personally reviewed and noted the following in the patient's chart:   . Medical and social history . Use of alcohol, tobacco or illicit drugs  . Current medications and supplements . Functional ability and status . Nutritional status . Physical activity . Advanced directives . List of other physicians . Hospitalizations, surgeries, and ER visits in previous 12 months . Vitals . Screenings to include cognitive, depression, and falls . Referrals and appointments  In addition, I have reviewed and discussed with patient certain preventive protocols, quality metrics, and best practice recommendations. A written personalized care plan for preventive services as well as general preventive health recommendations were provided to patient.     Varney Biles, LPN   12/75/1700

## 2017-05-20 NOTE — Patient Instructions (Addendum)
Ms. Rigsby , Thank you for taking time to come for your Medicare Wellness Visit. I appreciate your ongoing commitment to your health goals. Please review the following plan we discussed and let me know if I can assist you in the future.   Follow up with primary care physician as needed.    Bring a copy of your [place] and/or Living Will to be scanned into chart.  Mammogram as directed.  Have a great day!  These are the goals we discussed: Goals    . Increase physical activity     Zumba and swim for exercise        This is a list of the screening recommended for you and due dates:  Health Maintenance  Topic Date Due  . Mammogram  05/13/2018  . Tetanus Vaccine  10/31/2018  . Colon Cancer Screening  05/26/2021  . Flu Shot  Completed  . DEXA scan (bone density measurement)  Completed  .  Hepatitis C: One time screening is recommended by Center for Disease Control  (CDC) for  adults born from [birth year] through 1964.   Completed  . Pneumonia vaccines  Completed    Mammogram A mammogram is an X-ray of the breasts that is done to check for abnormal changes. This procedure can screen for and detect any changes that may suggest breast cancer. A mammogram can also identify other changes and variations in the breast, such as:  Inflammation of the breast tissue (mastitis).  An infected area that contains a collection of pus (abscess).  A fluid-filled sac (cyst).  Fibrocystic changes. This is when breast tissue becomes denser, which can make the tissue feel rope-like or uneven under the skin.  Tumors that are not cancerous (benign).  Tell a health care provider about:  Any allergies you have.  If you have breast implants.  If you have had previous breast disease, biopsy, or surgery.  If you are breastfeeding.  Any possibility that you could be pregnant, if this applies.  If you are younger than age 54.  If you have a family history of breast  cancer. What are the risks? Generally, this is a safe procedure. However, problems may occur, including:  Exposure to radiation. Radiation levels are very low with this test.  The results being misinterpreted.  The need for further tests.  The inability of the mammogram to detect certain cancers.  What happens before the procedure?  Schedule your test about 1-2 weeks after your menstrual period. This is usually when your breasts are the least tender.  If you have had a mammogram done at a different facility in the past, get the mammogram X-rays or have them sent to your current exam facility in order to compare them.  Wash your breasts and under your arms the day of the test.  Do not wear deodorants, perfumes, lotions, or powders anywhere on your body on the day of the test.  Remove any jewelry from your neck.  Wear clothes that you can change into and out of easily. What happens during the procedure?  You will undress from the waist up and put on a gown.  You will stand in front of the X-ray machine.  Each breast will be placed between two plastic or glass plates. The plates will compress your breast for a few seconds. Try to stay as relaxed as possible during the procedure. This does not cause any harm to your breasts and any discomfort you feel will be  very brief.  X-rays will be taken from different angles of each breast. The procedure may vary among health care providers and hospitals. What happens after the procedure?  The mammogram will be examined by a specialist (radiologist).  You may need to repeat certain parts of the test, depending on the quality of the images. This is commonly done if the radiologist needs a better view of the breast tissue.  Ask when your test results will be ready. Make sure you get your test results.  You may resume your normal activities. This information is not intended to replace advice given to you by your health care provider. Make  sure you discuss any questions you have with your health care provider. Document Released: 05/09/2000 Document Revised: 10/15/2015 Document Reviewed: 07/21/2014 Elsevier Interactive Patient Education  [employer].

## 2017-05-22 ENCOUNTER — Other Ambulatory Visit (INDEPENDENT_AMBULATORY_CARE_PROVIDER_SITE_OTHER): Payer: Medicare Other

## 2017-05-22 ENCOUNTER — Other Ambulatory Visit: Payer: Medicare Other

## 2017-05-22 DIAGNOSIS — E559 Vitamin D deficiency, unspecified: Secondary | ICD-10-CM

## 2017-05-22 DIAGNOSIS — R7309 Other abnormal glucose: Secondary | ICD-10-CM

## 2017-05-22 DIAGNOSIS — E663 Overweight: Secondary | ICD-10-CM | POA: Diagnosis not present

## 2017-05-22 DIAGNOSIS — R300 Dysuria: Secondary | ICD-10-CM | POA: Diagnosis not present

## 2017-05-22 DIAGNOSIS — E785 Hyperlipidemia, unspecified: Secondary | ICD-10-CM

## 2017-05-22 DIAGNOSIS — Z1329 Encounter for screening for other suspected endocrine disorder: Secondary | ICD-10-CM

## 2017-05-22 LAB — URINALYSIS, ROUTINE W REFLEX MICROSCOPIC
BILIRUBIN URINE: NEGATIVE
Ketones, ur: NEGATIVE
Nitrite: NEGATIVE
PH: 6 (ref 5.0–8.0)
SPECIFIC GRAVITY, URINE: 1.02 (ref 1.000–1.030)
Total Protein, Urine: NEGATIVE
Urine Glucose: NEGATIVE
Urobilinogen, UA: 0.2 (ref 0.0–1.0)

## 2017-05-22 LAB — HEMOGLOBIN A1C: Hgb A1c MFr Bld: 5.4 % (ref 4.6–6.5)

## 2017-05-22 LAB — VITAMIN D 25 HYDROXY (VIT D DEFICIENCY, FRACTURES): VITD: 28.09 ng/mL — ABNORMAL LOW (ref 30.00–100.00)

## 2017-05-22 LAB — CBC WITH DIFFERENTIAL/PLATELET
BASOS ABS: 0 10*3/uL (ref 0.0–0.1)
BASOS PCT: 0.8 % (ref 0.0–3.0)
Eosinophils Absolute: 0.1 10*3/uL (ref 0.0–0.7)
Eosinophils Relative: 2.1 % (ref 0.0–5.0)
HEMATOCRIT: 44.4 % (ref 36.0–46.0)
Hemoglobin: 14.6 g/dL (ref 12.0–15.0)
Lymphocytes Relative: 34.7 % (ref 12.0–46.0)
Lymphs Abs: 1.7 10*3/uL (ref 0.7–4.0)
MCHC: 33 g/dL (ref 30.0–36.0)
MCV: 93 fl (ref 78.0–100.0)
MONOS PCT: 6.7 % (ref 3.0–12.0)
Monocytes Absolute: 0.3 10*3/uL (ref 0.1–1.0)
NEUTROS ABS: 2.8 10*3/uL (ref 1.4–7.7)
Neutrophils Relative %: 55.7 % (ref 43.0–77.0)
PLATELETS: 270 10*3/uL (ref 150.0–400.0)
RBC: 4.77 Mil/uL (ref 3.87–5.11)
RDW: 13.6 % (ref 11.5–15.5)
WBC: 5 10*3/uL (ref 4.0–10.5)

## 2017-05-22 LAB — LIPID PANEL
Cholesterol: 248 mg/dL — ABNORMAL HIGH (ref 0–200)
HDL: 47 mg/dL (ref >39.00)
LDL Cholesterol: 173 mg/dL — ABNORMAL HIGH (ref 0–99)
NonHDL: 201
TRIGLYCERIDES: 139 mg/dL (ref 0.0–149.0)
Total CHOL/HDL Ratio: 5
VLDL: 27.8 mg/dL (ref 0.0–40.0)

## 2017-05-22 LAB — COMPREHENSIVE METABOLIC PANEL
ALBUMIN: 4.2 g/dL (ref 3.5–5.2)
ALK PHOS: 84 U/L (ref 39–117)
ALT: 22 U/L (ref 0–35)
AST: 19 U/L (ref 0–37)
BILIRUBIN TOTAL: 0.6 mg/dL (ref 0.2–1.2)
BUN: 18 mg/dL (ref 6–23)
CALCIUM: 9.2 mg/dL (ref 8.4–10.5)
CO2: 29 meq/L (ref 19–32)
Chloride: 106 mEq/L (ref 96–112)
Creatinine, Ser: 0.83 mg/dL (ref 0.40–1.20)
GFR: 72.18 mL/min (ref >60.00)
Glucose, Bld: 97 mg/dL (ref 70–99)
Potassium: 3.8 mEq/L (ref 3.5–5.1)
Sodium: 141 mEq/L (ref 135–145)
TOTAL PROTEIN: 6.9 g/dL (ref 6.0–8.3)

## 2017-05-22 LAB — TSH: TSH: 3.36 u[IU]/mL (ref 0.35–4.50)

## 2017-05-27 ENCOUNTER — Telehealth: Payer: Self-pay | Admitting: *Deleted

## 2017-05-27 NOTE — Telephone Encounter (Signed)
Patient was informed of results.  Patient understood and no questions, comments, or concerns at this time.  Patient is taking 2000 IU VItD. Patient does not want to take statins. Patient will call and schedule appointment in feb to be placed on schedule with margaret.

## 2017-05-27 NOTE — Telephone Encounter (Signed)
Copied from [place] [id number]. Topic: Quick Communication - Office Called Patient >> May 25, 2017 12:50 PM Lara Mulch, [place]ib[place], [place] wrote: Reason for CRM: Patient had a missed call from the office and would like a call back at [phone]

## 2017-05-27 NOTE — Telephone Encounter (Signed)
Did you call?

## 2017-05-28 ENCOUNTER — Ambulatory Visit: Payer: Medicare Other | Admitting: Internal Medicine

## 2017-06-06 IMAGING — MG MM DIGITAL SCREENING BILAT W/ TOMO W/ CAD
8 of 13 series · 8 of 29 positions shown · non-contrast
Comparison: Previous exam(s).

ACR Breast Density Category a: The breast tissue is almost entirely
fatty.

CLINICAL DATA: Screening.

EXAM:
2D DIGITAL SCREENING BILATERAL MAMMOGRAM WITH CAD AND ADJUNCT TOMO

[R MLO (1 of 2)]
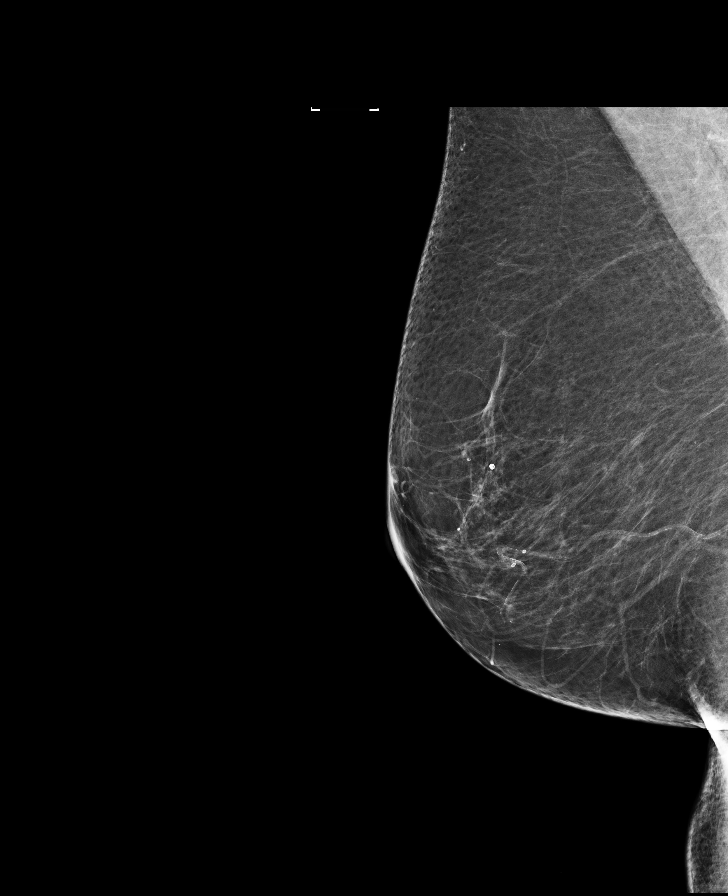

[R CC]
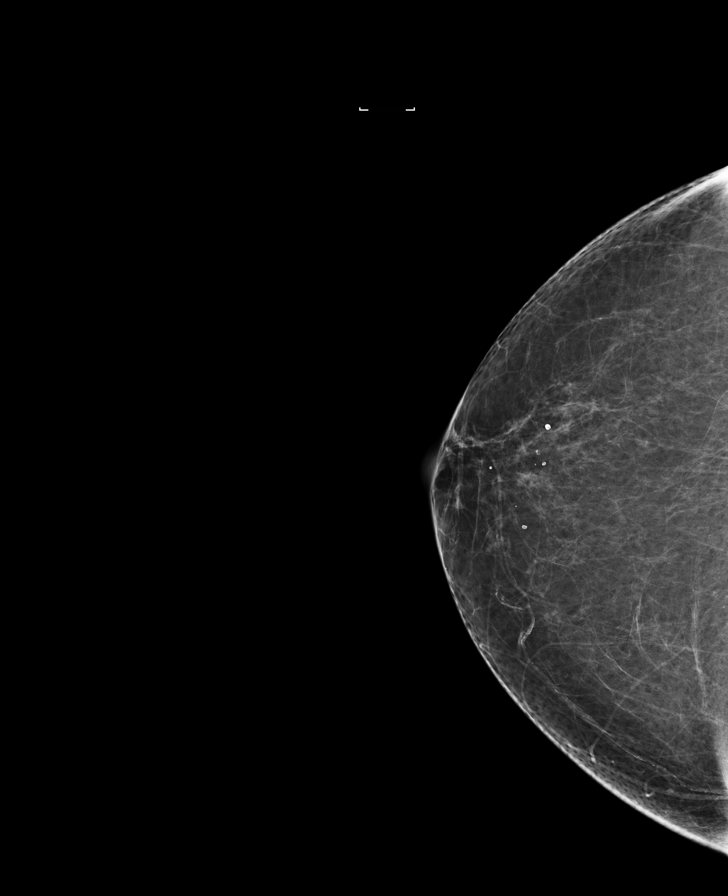

[L MLO synth-2D]
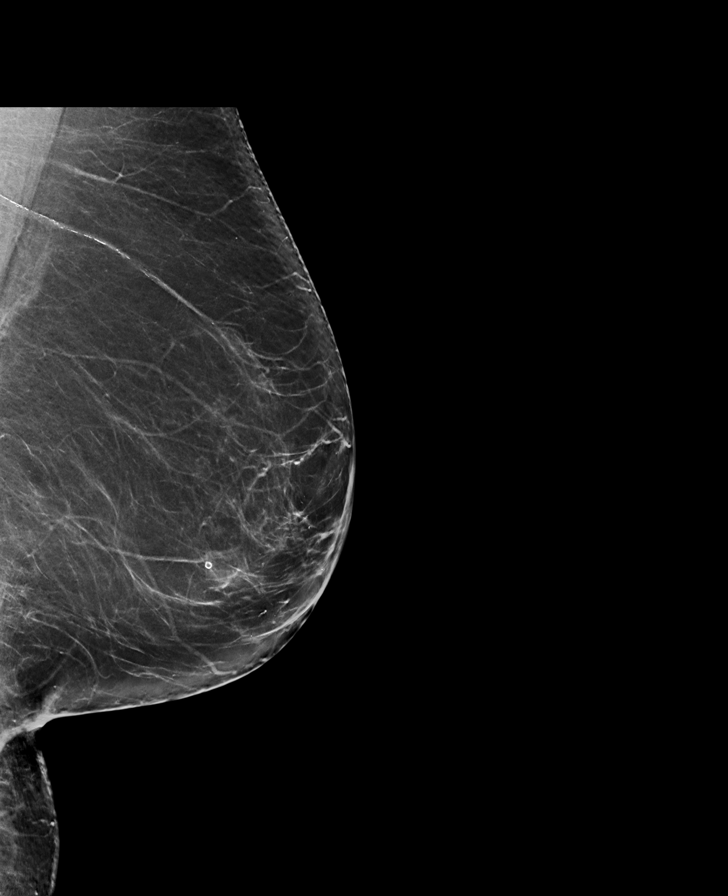

[L CC synth-2D]
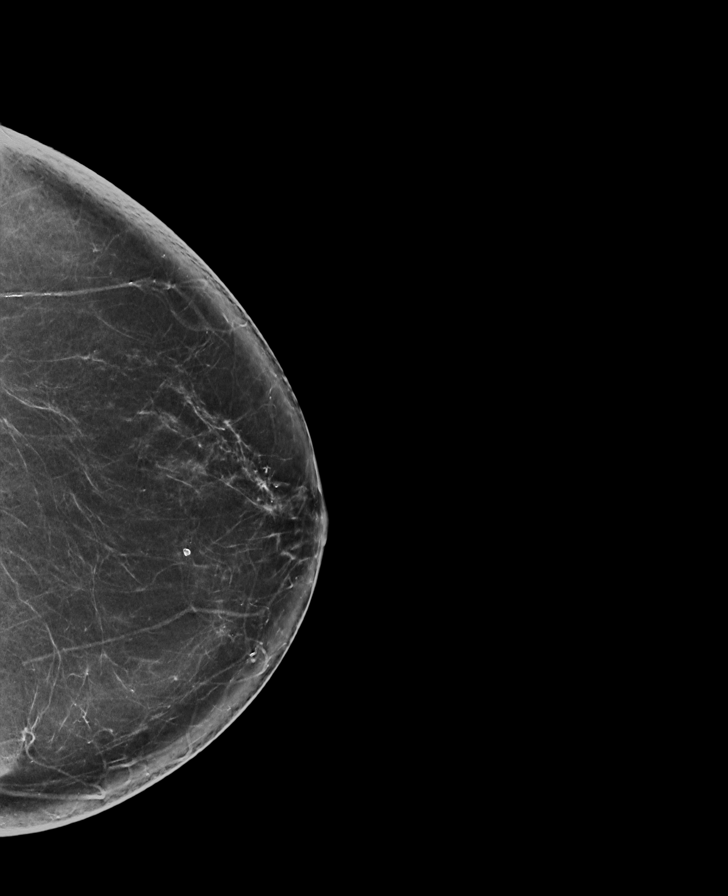

[L MLO]
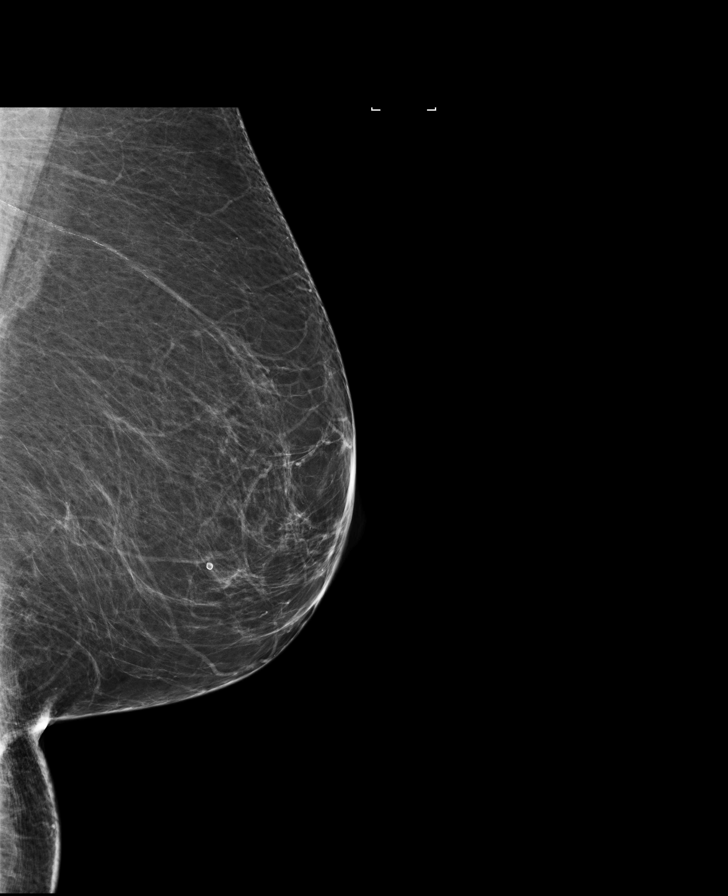

[R MLO (2 of 2)]
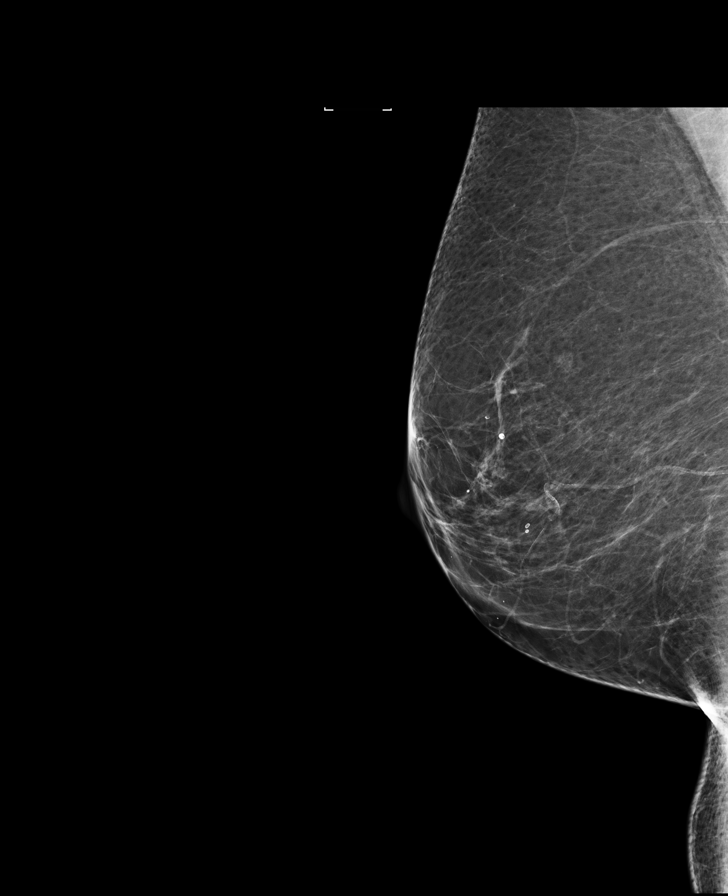

[R MLO synth-2D]
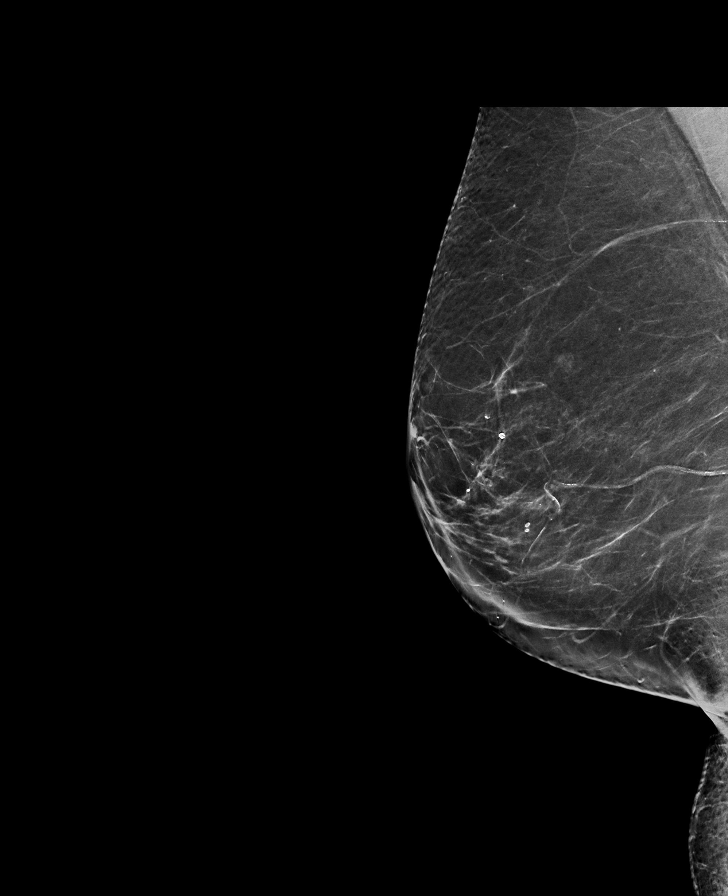

[L CC]
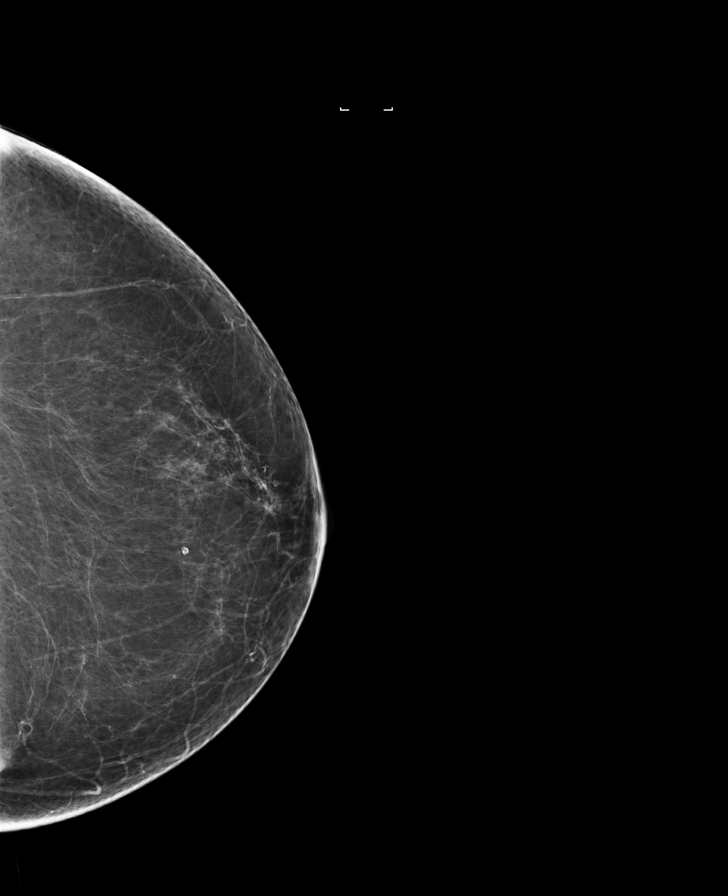

[8 of 29 positions shown; findings below may reference images not displayed]

FINDINGS: There are no findings suspicious for malignancy. Images were
processed with CAD.
IMPRESSION: No mammographic evidence of malignancy. A result letter of this
screening mammogram will be mailed directly to the patient.

RECOMMENDATION:
Screening mammogram in one year. (Code:1B-X-8P0)

BI-RADS CATEGORY  1: Negative.

## 2017-06-17 ENCOUNTER — Telehealth: Payer: Self-pay

## 2017-06-17 NOTE — Telephone Encounter (Signed)
Copied from [place] [id number] [id number]. Topic: General - Other >> Jun 17, 2017  2:35 PM Lolita Rieger, [place] wrote: Reason for CRM: pt called and  would like a call back for a coding error from her visit on 05/20/17  Please call pt [phone] 

## 2017-06-18 ENCOUNTER — Encounter: Payer: Self-pay | Admitting: Family

## 2017-06-19 NOTE — Telephone Encounter (Signed)
This message has been forwarded to billing and coding for review of the patients DOS.

## 2017-06-19 NOTE — Telephone Encounter (Signed)
Coding has been changed and claim has been resubmitted.

## 2017-06-24 ENCOUNTER — Other Ambulatory Visit: Payer: Self-pay | Admitting: Obstetrics and Gynecology

## 2017-06-24 ENCOUNTER — Other Ambulatory Visit: Payer: Self-pay | Admitting: Internal Medicine

## 2017-06-24 DIAGNOSIS — Z1231 Encounter for screening mammogram for malignant neoplasm of breast: Secondary | ICD-10-CM

## 2017-07-10 ENCOUNTER — Ambulatory Visit
Admission: RE | Admit: 2017-07-10 | Discharge: 2017-07-10 | Disposition: A | Discharge status: Home / Self Care | Payer: Medicare Other | Source: Ambulatory Visit | Attending: Internal Medicine | Admitting: Internal Medicine

## 2017-07-10 DIAGNOSIS — Z1231 Encounter for screening mammogram for malignant neoplasm of breast: Secondary | ICD-10-CM | POA: Diagnosis not present

## 2017-12-23 ENCOUNTER — Encounter: Payer: Self-pay | Admitting: Podiatry

## 2017-12-23 ENCOUNTER — Ambulatory Visit (INDEPENDENT_AMBULATORY_CARE_PROVIDER_SITE_OTHER): Payer: Medicare Other | Admitting: Podiatry

## 2017-12-23 VITALS — BP 165/93 | HR 66 | Resp 16

## 2017-12-23 DIAGNOSIS — L600 Ingrowing nail: Secondary | ICD-10-CM

## 2017-12-23 NOTE — Progress Notes (Signed)
  Subjective:  Patient ID: Lynn Cochran, female    DOB: 10-09-1945,  MRN: [id number] HPI Chief Complaint  Patient presents with  . Toe Pain    Hallux right - lateral border, was red and swollen, but has gotten a little better since soaking TID  . New Patient (Initial Visit)    Est pt - 70    71 y.o. female presents with the above complaint.   ROS: Denies fever chills nausea vomiting muscle aches pains calf pain back pain chest pain shortness of breath headache.  Past Medical History:  Diagnosis Date  . Chicken pox    Past Surgical History:  Procedure Laterality Date  . APPENDECTOMY  1960  . AUGMENTATION MAMMAPLASTY Bilateral 1980's   implants  . AUGMENTATION MAMMAPLASTY Bilateral 2004   implants removed and mastopexi  . BRAIN SURGERY     Mastopexy  . HAND SURGERY    . HAND SURGERY     Hemangioma Left Palm   No current outpatient medications on file.  Allergies  Allergen Reactions  . Paba Derivatives    Review of Systems Objective:   Vitals:   12/23/17 1408  BP: (!) 165/93  Pulse: 66  Resp: 16    General: Well developed, nourished, in no acute distress, alert and oriented x3   Dermatological: Skin is warm, dry and supple bilateral. Nails x 10 are well maintained; remaining integument appears unremarkable at this time. There are no open sores, no preulcerative lesions, no rash or signs of infection present.  Incurvated nail margin fibular border hallux right with what appears to be a resolving abscess.   Vascular: Dorsalis Pedis artery and Posterior Tibial artery pedal pulses are 2/4 bilateral with immedate capillary fill time. Pedal hair growth present. No varicosities and no lower extremity edema present bilateral.   Neruologic: Grossly intact via light touch bilateral. Vibratory intact via tuning fork bilateral. Protective threshold with Semmes Wienstein monofilament intact to all pedal sites bilateral. Patellar and Achilles deep tendon reflexes 2+  bilateral. No Babinski or clonus noted bilateral.   Musculoskeletal: No gross boney pedal deformities bilateral. No pain, crepitus, or limitation noted with foot and ankle range of motion bilateral. Muscular strength 5/5 in all groups tested bilateral.  Gait: Unassisted, Nonantalgic.    Radiographs:  None taken  Assessment & Plan:   Assessment: Resolving abscess ingrown nail fibular border hallux right  Plan: I took a small piece of the nail out of the margin she tolerated it well and resolved her symptoms.     Lynn Cochran T. [place], [place]

## 2018-01-04 ENCOUNTER — Ambulatory Visit (INDEPENDENT_AMBULATORY_CARE_PROVIDER_SITE_OTHER): Payer: Medicare Other | Admitting: Family

## 2018-01-04 ENCOUNTER — Encounter: Payer: Self-pay | Admitting: Family

## 2018-01-04 ENCOUNTER — Telehealth: Payer: Self-pay

## 2018-01-04 VITALS — BP 160/90 | HR 78 | Temp 98.5°F | Resp 16 | Wt 176.2 lb

## 2018-01-04 DIAGNOSIS — I10 Essential (primary) hypertension: Secondary | ICD-10-CM | POA: Insufficient documentation

## 2018-01-04 DIAGNOSIS — R319 Hematuria, unspecified: Secondary | ICD-10-CM

## 2018-01-04 DIAGNOSIS — H532 Diplopia: Secondary | ICD-10-CM | POA: Diagnosis not present

## 2018-01-04 DIAGNOSIS — E663 Overweight: Secondary | ICD-10-CM

## 2018-01-04 DIAGNOSIS — R030 Elevated blood-pressure reading, without diagnosis of hypertension: Secondary | ICD-10-CM

## 2018-01-04 LAB — B12 AND FOLATE PANEL
Folate: 23.6 ng/mL (ref >5.9)
Vitamin B-12: 195 pg/mL — ABNORMAL LOW (ref 211–911)

## 2018-01-04 MED ORDER — AMLODIPINE BESYLATE 5 MG PO TABS: 5.0000 mg | ORAL_TABLET | Freq: Every day | ORAL | 3 refills | Status: DC

## 2018-01-04 NOTE — Telephone Encounter (Signed)
Patient gave urine sample but in route to the lab it spilled they need for her to give a new one. Can you please contact patient to possibly come and give another one.

## 2018-01-04 NOTE — Assessment & Plan Note (Signed)
Pending UA.  

## 2018-01-04 NOTE — Assessment & Plan Note (Signed)
Discussed concerns at length. Patient weight stable. Will continue to monitor

## 2018-01-04 NOTE — Assessment & Plan Note (Signed)
Chronic.  Reassured patient has improvement after several sessions of acupuncture.  Pending b12 for possible implication.

## 2018-01-04 NOTE — Telephone Encounter (Signed)
Order placed , patient advised

## 2018-01-04 NOTE — Addendum Note (Signed)
Addended by: Johna Sheriff on: 01/04/2018 04:54 PM   Modules accepted: Orders

## 2018-01-04 NOTE — Assessment & Plan Note (Signed)
New. Start amlodipine. Patient will let me know how she is doing.

## 2018-01-04 NOTE — Patient Instructions (Signed)
Labs today   Start amlodipine  Monitor blood pressure,  Goal is less than 130/80; if persistently higher, please make sooner follow up appointment so we can recheck you blood pressure and manage medications

## 2018-01-04 NOTE — Progress Notes (Signed)
Subjective:    Patient ID: Lynn Cochran, female    DOB: November 08, 1945, 72 y.o.   MRN: [id number]  CC: Lynn Cochran is a 72 y.o. female who presents today for follow up.   HPI: Concerns with blood pressure over past couple of weeks. Ranges from 147/85, 144/81. Has never been on blood pressure medications in the past. She doesn't snore.   Denies exertional chest pain or pressure, numbness or tingling radiating to left arm or jaw, palpitations, dizziness, frequent headaches,   or shortness of breath.   Also frustrated by trouble loosing weight. Excercises daily, walks dog, stretch exercises, and zumba 2x / week.  Reduced portion sizes. Weight is unchanged.   Chronic double vision with distance since 2005, improved. Mostly when driving. Follows with  Dr Georgette Dover. Lenses placed in eyes 11/2017. Tried acupunture recently which has helped. She also noted chronic occasional tingling in BL legs and feet. Would like to b12 tested.   No dysuria. No h/o renal stones. Nonsmoker.     HISTORY:  Past Medical History:  Diagnosis Date  . Chicken pox    Past Surgical History:  Procedure Laterality Date  . APPENDECTOMY  1960  . AUGMENTATION MAMMAPLASTY Bilateral 1980's   implants  . AUGMENTATION MAMMAPLASTY Bilateral 2004   implants removed and mastopexi  . BRAIN SURGERY     Mastopexy  . EYE SURGERY Bilateral    trifocal lenses placed BL  . HAND SURGERY    . HAND SURGERY     Hemangioma Left Palm   Family History  Problem Relation Age of Onset  . Heart disease Mother   . Alcohol abuse Mother   . Congestive Heart Failure Mother   . Sudden death Father 38       Car Accident  . Diabetes Paternal Grandmother   . Brain cancer Sister        glioblastoma  . Lung cancer Sister     Allergies: Paba derivatives No current outpatient medications on file prior to visit.   No current facility-administered medications on file prior to visit.     Social History   Tobacco Use    . Smoking status: Never Smoker  . Smokeless tobacco: Former Network engineer Use Topics  . Alcohol use: Yes    Alcohol/week: 0.0 standard drinks    Comment: Socially   . Drug use: No    Review of Systems  Constitutional: Negative for chills and fever.  Eyes: Positive for visual disturbance (chronic).  Respiratory: Negative for cough.   Cardiovascular: Negative for chest pain and palpitations.  Gastrointestinal: Negative for nausea and vomiting.  Genitourinary: Negative for dysuria and hematuria.  Neurological: Positive for numbness (BLE).      Objective:    BP (!) 160/90 (BP Location: Left Arm, Patient Position: Sitting, Cuff Size: Normal)   Pulse 78   Temp 98.5 F (36.9 C) (Oral)   Resp 16   Wt 176 lb 4 oz (79.9 kg)   SpO2 99%   BMI 28.45 kg/m  BP Readings from Last 3 Encounters:  01/04/18 (!) 160/90  12/23/17 (!) 165/93  05/20/17 140/88   Wt Readings from Last 3 Encounters:  01/04/18 176 lb 4 oz (79.9 kg)  05/20/17 176 lb (79.8 kg)  04/01/16 173 lb (78.5 kg)    Physical Exam  Constitutional: She appears well-developed and well-nourished.  Eyes: Conjunctivae are normal.  Cardiovascular: Normal rate, regular rhythm, normal heart sounds and normal pulses.  Pulmonary/Chest: Effort normal and breath  sounds normal. She has no wheezes. She has no rhonchi. She has no rales.  Neurological: She is alert.  Skin: Skin is warm and dry.  Psychiatric: She has a normal mood and affect. Her speech is normal and behavior is normal. Thought content normal.  Vitals reviewed.      Assessment & Plan:   Problem List Items Addressed This Visit      Other   Overweight    Discussed concerns at length. Patient weight stable. Will continue to monitor      Double vision    Chronic.  Reassured patient has improvement after several sessions of acupuncture.  Pending b12 for possible implication.       Relevant Orders   B12 and Folate Panel   Elevated blood pressure reading -  Primary    New. Start amlodipine. Patient will let me know how she is doing.      Relevant Medications   amLODipine (NORVASC) 5 MG tablet   Hematuria    Pending UA.       Relevant Orders   Urinalysis, Routine w reflex microscopic       I am having Lynn Cochran start on amLODipine.   Meds ordered this encounter  Medications  . amLODipine (NORVASC) 5 MG tablet    Sig: Take 1 tablet (5 mg total) by mouth daily.    Dispense:  90 tablet    Refill:  3    Order Specific Question:   Supervising Provider    Answer:   Crecencio Mc [[id number]]    Return precautions given.   Risks, benefits, and alternatives of the medications and treatment plan prescribed today were discussed, and patient expressed understanding.   Education regarding symptom management and diagnosis given to patient on AVS.  Continue to follow with Burnard Hawthorne, FNP for routine health maintenance.   Lubertha [place] and I agreed with plan.   Mable Paris, FNP

## 2018-01-05 ENCOUNTER — Other Ambulatory Visit (INDEPENDENT_AMBULATORY_CARE_PROVIDER_SITE_OTHER): Payer: Medicare Other

## 2018-01-05 DIAGNOSIS — R319 Hematuria, unspecified: Secondary | ICD-10-CM | POA: Diagnosis not present

## 2018-01-05 LAB — URINALYSIS, ROUTINE W REFLEX MICROSCOPIC
BILIRUBIN URINE: NEGATIVE
KETONES UR: NEGATIVE
NITRITE: NEGATIVE
Specific Gravity, Urine: 1.015 (ref 1.000–1.030)
TOTAL PROTEIN, URINE-UPE24: NEGATIVE
URINE GLUCOSE: NEGATIVE
UROBILINOGEN UA: 0.2 (ref 0.0–1.0)
pH: 7 (ref 5.0–8.0)

## 2018-01-06 ENCOUNTER — Encounter: Payer: Self-pay | Admitting: Family

## 2018-01-06 ENCOUNTER — Other Ambulatory Visit: Payer: Self-pay | Admitting: Family

## 2018-01-06 ENCOUNTER — Telehealth: Payer: Self-pay

## 2018-01-06 DIAGNOSIS — R319 Hematuria, unspecified: Secondary | ICD-10-CM

## 2018-01-06 DIAGNOSIS — N952 Postmenopausal atrophic vaginitis: Secondary | ICD-10-CM

## 2018-01-06 DIAGNOSIS — E538 Deficiency of other specified B group vitamins: Secondary | ICD-10-CM | POA: Insufficient documentation

## 2018-01-06 MED ORDER — ESTRADIOL 0.1 MG/GM VA CREA: TOPICAL_CREAM | VAGINAL | 0 refills | Status: DC

## 2018-01-06 NOTE — Telephone Encounter (Signed)
Copied from [place] [id number]. Topic: General - Other >> Jan 06, 2018  2:51 PM Keene Breath wrote: Reason for CRM: Varney Biles with [facility] called to get some clarification on a prescription for estradiol (ESTRACE) 0.1 MG/GM vaginal cream.  Should it be a compound.  Please advise.  CB# [phone].

## 2018-01-06 NOTE — Telephone Encounter (Signed)
Can we send estrace to [facility]?  See mychart

## 2018-01-06 NOTE — Telephone Encounter (Signed)
Pharmacy advised compound ok

## 2018-01-06 NOTE — Telephone Encounter (Signed)
Compound is fine.

## 2018-01-06 NOTE — Telephone Encounter (Signed)
Script faxed to [facility]

## 2018-01-06 NOTE — Telephone Encounter (Signed)
This is a compounded medication , ? Correct

## 2018-01-07 ENCOUNTER — Encounter: Payer: Self-pay | Admitting: Obstetrics and Gynecology

## 2018-01-07 MED ORDER — AMBULATORY NON FORMULARY MEDICATION: 0 refills | Status: DC

## 2018-01-07 NOTE — Telephone Encounter (Signed)
Rx RF till annual 11/17. Pt has Medicare and can't have annual but Q2 yrs.

## 2018-01-10 ENCOUNTER — Encounter: Payer: Self-pay | Admitting: Family

## 2018-01-12 NOTE — Telephone Encounter (Signed)
° °  Pt said she her legs and feet are swelling because of the below med she said she will no longer take the med and is asking for a replacement    Valsartan is her choice   amLODipine (NORVASC) 5 MG tablet   Pharmacy [facility] [address]

## 2018-01-12 NOTE — Telephone Encounter (Signed)
Left voicemail for patient to ensure no other problems with legs, ie redness, warmth to touch.

## 2018-01-18 ENCOUNTER — Encounter: Payer: Self-pay | Admitting: Family

## 2018-01-20 ENCOUNTER — Ambulatory Visit: Payer: Medicare Other | Admitting: Emergency Medicine

## 2018-01-20 ENCOUNTER — Other Ambulatory Visit: Payer: Self-pay | Admitting: Family

## 2018-01-20 ENCOUNTER — Other Ambulatory Visit: Payer: Self-pay | Admitting: *Deleted

## 2018-01-20 ENCOUNTER — Telehealth: Payer: Self-pay | Admitting: Family

## 2018-01-20 DIAGNOSIS — R030 Elevated blood-pressure reading, without diagnosis of hypertension: Secondary | ICD-10-CM

## 2018-01-20 MED ORDER — VALSARTAN 40 MG PO TABS: 40.0000 mg | ORAL_TABLET | Freq: Every day | ORAL | 1 refills | Status: DC

## 2018-01-20 NOTE — Progress Notes (Signed)
Valsartan 40 mg resent to correct pharmacy: [id number]  [address], [place].

## 2018-01-20 NOTE — Telephone Encounter (Signed)
Copied from [place] [id number]. Topic: Quick Communication - Rx Refill/Question >> Jan 20, 2018  9:30 AM Sheran Luz wrote: Medication: valsartan (DIOVAN) 40 MG tablet [[id number]]   Pt states that this medication was sent to the wrong pharmacy. Pt would like it re sent to her preferred pharmacy below. Please advise.    Preferred Pharmacy (with phone number or street name): [id number] [place], [place] [phone] (Phone) [phone] (Fax)    Agent: Please be advised that RX refills may take up to 3 business days. We ask that you follow-up with your pharmacy.

## 2018-01-20 NOTE — Progress Notes (Signed)
close

## 2018-01-27 ENCOUNTER — Ambulatory Visit: Payer: Self-pay | Admitting: Urology

## 2018-02-25 ENCOUNTER — Other Ambulatory Visit (INDEPENDENT_AMBULATORY_CARE_PROVIDER_SITE_OTHER): Payer: Medicare Other

## 2018-02-25 ENCOUNTER — Telehealth: Payer: Self-pay

## 2018-02-25 DIAGNOSIS — Z23 Encounter for immunization: Secondary | ICD-10-CM | POA: Diagnosis not present

## 2018-02-25 DIAGNOSIS — R319 Hematuria, unspecified: Secondary | ICD-10-CM

## 2018-02-25 DIAGNOSIS — E538 Deficiency of other specified B group vitamins: Secondary | ICD-10-CM

## 2018-02-25 DIAGNOSIS — R030 Elevated blood-pressure reading, without diagnosis of hypertension: Secondary | ICD-10-CM | POA: Diagnosis not present

## 2018-02-25 LAB — URINALYSIS, ROUTINE W REFLEX MICROSCOPIC
Bilirubin Urine: NEGATIVE
Ketones, ur: NEGATIVE
Leukocytes, UA: NEGATIVE
Nitrite: NEGATIVE
SPECIFIC GRAVITY, URINE: 1.015 (ref 1.000–1.030)
Total Protein, Urine: NEGATIVE
UROBILINOGEN UA: 0.2 (ref 0.0–1.0)
Urine Glucose: NEGATIVE
pH: 6 (ref 5.0–8.0)

## 2018-02-25 LAB — BASIC METABOLIC PANEL
BUN: 20 mg/dL (ref 6–23)
CO2: 27 meq/L (ref 19–32)
Calcium: 10 mg/dL (ref 8.4–10.5)
Chloride: 105 mEq/L (ref 96–112)
Creatinine, Ser: 0.8 mg/dL (ref 0.40–1.20)
GFR: 75.15 mL/min (ref >60.00)
GLUCOSE: 96 mg/dL (ref 70–99)
POTASSIUM: 4.2 meq/L (ref 3.5–5.1)
Sodium: 139 mEq/L (ref 135–145)

## 2018-02-25 LAB — B12 AND FOLATE PANEL
Folate: 20 ng/mL (ref >5.9)
Vitamin B-12: 1216 pg/mL — ABNORMAL HIGH (ref 211–911)

## 2018-02-25 NOTE — Telephone Encounter (Signed)
Lab order placed for urine

## 2018-02-25 NOTE — Addendum Note (Signed)
Addended by: Leeanne Rio on: 02/25/2018 09:16 AM   Modules accepted: Orders

## 2018-03-08 ENCOUNTER — Encounter: Payer: Self-pay | Admitting: Family

## 2018-03-08 ENCOUNTER — Ambulatory Visit (INDEPENDENT_AMBULATORY_CARE_PROVIDER_SITE_OTHER): Payer: Medicare Other | Admitting: Family

## 2018-03-08 VITALS — BP 130/78 | HR 66 | Temp 98.1°F | Resp 14 | Ht 66.0 in | Wt 172.0 lb

## 2018-03-08 DIAGNOSIS — E785 Hyperlipidemia, unspecified: Secondary | ICD-10-CM | POA: Diagnosis not present

## 2018-03-08 DIAGNOSIS — H532 Diplopia: Secondary | ICD-10-CM

## 2018-03-08 DIAGNOSIS — I10 Essential (primary) hypertension: Secondary | ICD-10-CM | POA: Diagnosis not present

## 2018-03-08 DIAGNOSIS — E538 Deficiency of other specified B group vitamins: Secondary | ICD-10-CM | POA: Diagnosis not present

## 2018-03-08 NOTE — Assessment & Plan Note (Signed)
Declines medication therapy. Will follow trend at [facility]pe.

## 2018-03-08 NOTE — Assessment & Plan Note (Signed)
Resolved based on labs. Will continue 1000mg  b12 as has helped lower extremity numbness. Will follow

## 2018-03-08 NOTE — Assessment & Plan Note (Signed)
Close to goal. Patient declines increasing valsartan based on newest guidelines ( < 120/80). Will continue to monitor.

## 2018-03-08 NOTE — Patient Instructions (Signed)
Monitor blood pressure,  Goal is less than 120/80, based on newest guidelines; if persistently higher, please make sooner follow up appointment so we can recheck you blood pressure and manage medications  Look forward to your physical  Congrats on weight loss!

## 2018-03-08 NOTE — Progress Notes (Signed)
Subjective:    Patient ID: Lynn Cochran, female    DOB: 08/05/1945, 72 y.o.   MRN: [id number]  CC: Lynn Cochran is a 72 y.o. female who presents today for follow up.   HPI: Feels well today. No new complaints  HTN- at home BP 130-116/ 78. Compliant with diovan. Denies exertional chest pain or pressure, numbness or tingling radiating to left arm or jaw, palpitations, dizziness, frequent headaches, changes in vision, or shortness of breath.    Double vision- has improved significantly. Still on b12 1000mg  QD which has resolved LE numbness in feet. Acupuncture has helped. No headache, facial numbness or pain.   Started keto and lost 6 pounds. Walks 3 miles every day. Free weights QOD.   HLD- working on lifestyle. Doesn't want to do statins, zetia. On Crill.      Negative blood in urine b12 elevated  Returns for CPE 12/201   HISTORY:  Past Medical History:  Diagnosis Date  . Chicken pox    Past Surgical History:  Procedure Laterality Date  . APPENDECTOMY  1960  . AUGMENTATION MAMMAPLASTY Bilateral 1980's   implants  . AUGMENTATION MAMMAPLASTY Bilateral 2004   implants removed and mastopexi  . BRAIN SURGERY     Mastopexy  . EYE SURGERY Bilateral    trifocal lenses placed BL  . HAND SURGERY    . HAND SURGERY     Hemangioma Left Palm   Family History  Problem Relation Age of Onset  . Heart disease Mother   . Alcohol abuse Mother   . Congestive Heart Failure Mother   . Sudden death Father 36       Car Accident  . Diabetes Paternal Grandmother   . Brain cancer Sister        glioblastoma  . Lung cancer Sister     Allergies: Paba derivatives Current Outpatient Medications on File Prior to Visit  Medication Sig Dispense Refill  . AMBULATORY NON FORMULARY MEDICATION Medication Name: Estriol vaginal cream 1 mg/ml Insert 1 g vaginally once weekly 30 mL 0  . estradiol (ESTRACE) 0.1 MG/GM vaginal cream 0.5 g intravaginally 1-3 times per week. 42.5  g 0  . valsartan (DIOVAN) 40 MG tablet Take 1 tablet (40 mg total) by mouth daily. 90 tablet 1   No current facility-administered medications on file prior to visit.     Social History   Tobacco Use  . Smoking status: Never Smoker  . Smokeless tobacco: Former Network engineer Use Topics  . Alcohol use: Yes    Alcohol/week: 0.0 standard drinks    Comment: Socially   . Drug use: No    Review of Systems  Constitutional: Negative for chills and fever.  Eyes: Positive for visual disturbance.  Respiratory: Negative for cough.   Cardiovascular: Negative for chest pain and palpitations.  Gastrointestinal: Negative for nausea and vomiting.  Neurological: Negative for dizziness, numbness (resolved in feet) and headaches.      Objective:    BP 130/78   Pulse 66   Temp 98.1 F (36.7 C) (Oral)   Resp 14   Ht 5\' 6"  (1.676 m)   Wt 172 lb (78 kg)   SpO2 97%   BMI 27.76 kg/m  BP Readings from Last 3 Encounters:  03/08/18 130/78  01/04/18 (!) 160/90  12/23/17 (!) 165/93   Wt Readings from Last 3 Encounters:  03/08/18 172 lb (78 kg)  01/04/18 176 lb 4 oz (79.9 kg)  05/20/17 176 lb (79.8 kg)  Physical Exam  Constitutional: She appears well-developed and well-nourished.  Eyes: Conjunctivae are normal.  Cardiovascular: Normal rate, regular rhythm, normal heart sounds and normal pulses.  Pulmonary/Chest: Effort normal and breath sounds normal. She has no wheezes. She has no rhonchi. She has no rales.  Neurological: She is alert.  Skin: Skin is warm and dry.  Psychiatric: She has a normal mood and affect. Her speech is normal and behavior is normal. Thought content normal.  Vitals reviewed.      Assessment & Plan:   Problem List Items Addressed This Visit      Cardiovascular and Mediastinum   HTN (hypertension)    Close to goal. Patient declines increasing valsartan based on newest guidelines ( < 120/80). Will continue to monitor.         Other   HLD (hyperlipidemia)     Declines medication therapy. Will follow trend at cpe.      Double vision    Improved on acpuncuture, b12. Has seen Neurology ( portfillo, 2009 ) and ENT ( bennett, 2018) for this .  Declines further consults a thi time.       B12 deficiency    Resolved based on labs. Will continue 1000mg  b12 as has helped lower extremity numbness. Will follow          I am having Lynn Cochran maintain her estradiol, AMBULATORY NON FORMULARY MEDICATION, and valsartan.   No orders of the defined types were placed in this encounter.   Return precautions given.   Risks, benefits, and alternatives of the medications and treatment plan prescribed today were discussed, and patient expressed understanding.   Education regarding symptom management and diagnosis given to patient on AVS.  Continue to follow with Burnard Hawthorne, FNP for routine health maintenance.   Lynn Cochran and I agreed with plan.   Lynn Paris, FNP

## 2018-03-08 NOTE — Assessment & Plan Note (Signed)
Improved on acpuncuture, b12. Has seen Neurology ( portfillo, 2009 ) and ENT ( bennett, 2018) for this .  Declines further consults a thi time.

## 2018-03-30 ENCOUNTER — Encounter: Payer: Self-pay | Admitting: Family

## 2018-03-31 ENCOUNTER — Other Ambulatory Visit: Payer: Self-pay | Admitting: Family

## 2018-03-31 DIAGNOSIS — R030 Elevated blood-pressure reading, without diagnosis of hypertension: Secondary | ICD-10-CM

## 2018-03-31 MED ORDER — VALSARTAN 80 MG PO TABS: 80.0000 mg | ORAL_TABLET | Freq: Every day | ORAL | 1 refills | Status: DC

## 2018-04-01 ENCOUNTER — Encounter: Payer: Self-pay | Admitting: Obstetrics and Gynecology

## 2018-04-01 ENCOUNTER — Ambulatory Visit (INDEPENDENT_AMBULATORY_CARE_PROVIDER_SITE_OTHER): Payer: Medicare Other | Admitting: Obstetrics and Gynecology

## 2018-04-01 ENCOUNTER — Other Ambulatory Visit (HOSPITAL_COMMUNITY)
Admission: RE | Admit: 2018-04-01 | Discharge: 2018-04-01 | Disposition: A | Discharge status: Home / Self Care | Payer: Medicare Other | Source: Ambulatory Visit | Attending: Obstetrics and Gynecology | Admitting: Obstetrics and Gynecology

## 2018-04-01 VITALS — BP 146/80 | HR 70 | Ht 66.0 in | Wt 169.0 lb

## 2018-04-01 DIAGNOSIS — Z124 Encounter for screening for malignant neoplasm of cervix: Secondary | ICD-10-CM

## 2018-04-01 DIAGNOSIS — N941 Unspecified dyspareunia: Secondary | ICD-10-CM

## 2018-04-01 DIAGNOSIS — Z01419 Encounter for gynecological examination (general) (routine) without abnormal findings: Secondary | ICD-10-CM

## 2018-04-01 DIAGNOSIS — N952 Postmenopausal atrophic vaginitis: Secondary | ICD-10-CM

## 2018-04-01 DIAGNOSIS — Z1239 Encounter for other screening for malignant neoplasm of breast: Secondary | ICD-10-CM

## 2018-04-01 MED ORDER — AMBULATORY NON FORMULARY MEDICATION: 1 refills | Status: DC

## 2018-04-01 NOTE — Patient Instructions (Signed)
I value your feedback and entrusting us with your care. If you get a [facility] patient survey, I would appreciate you taking the time to let us know about your experience today. Thank you! 

## 2018-04-01 NOTE — Progress Notes (Signed)
PCP: Burnard Hawthorne, FNP   Chief Complaint  Patient presents with  . Gynecologic Exam    HPI:      Ms. Lynn Cochran is a 72 y.o. No obstetric history on file. who LMP was No LMP recorded. Patient is postmenopausal., presents today for her annual examination.  Her menses are absent due to menopause. She does not have intermenstrual bleeding. She does not have vasomotor sx.   Sex activity: single partner, contraception - post menopausal status. She does have vaginal dryness and uses Rx estriol vag crm with sx control. Needs RF (PCP won't manage Rx).  Last Pap: March 31, 2016  Results were: no abnormalities  Hx of STDs: none  Last mammogram: July 10, 2017  Results were: normal--routine follow-up in 12 months There is no FH of breast cancer. There is no FH of ovarian cancer. The patient does do self-breast exams.  Colonoscopy: colonoscopy 6 years ago without abnormalities.  Repeat due after 10 years.   Tobacco use: The patient denies current or previous tobacco use. Alcohol use: none Exercise: moderately active  She does get adequate calcium and Vitamin D in her diet.  Labs with PCP.  DEXA 12/17 WNL at [facility]   Past Medical History:  Diagnosis Date  . Chicken pox   . Hyperlipidemia   . Postmenopausal atrophic vaginitis 11/15/2012    Past Surgical History:  Procedure Laterality Date  . APPENDECTOMY  1960  . AUGMENTATION MAMMAPLASTY Bilateral 1980's   implants  . AUGMENTATION MAMMAPLASTY Bilateral 2004   implants removed and mastopexi  . BRAIN SURGERY     Mastopexy  . EYE SURGERY Bilateral    trifocal lenses placed BL  . HAND SURGERY    . HAND SURGERY     Hemangioma Left Palm  . HYSTEROSCOPY W/D&C     polypectomy  . OTHER SURGICAL HISTORY     colonoscopy, diagnostic; 2003,2013 wnl;2013-[facility]-WNL, repeat 10 yrs    Family History  Problem Relation Age of Onset  . Heart disease Mother   . Alcohol abuse Mother   . Congestive Heart Failure  Mother   . Hypertension Mother   . Sudden death Father 55       Car Accident  . Diabetes Paternal Grandmother   . Brain cancer Sister        glioblastoma  . Lung cancer Sister 74    Social History   Socioeconomic History  . Marital status: Married    Spouse name: Not on file  . Number of children: Not on file  . Years of education: Not on file  . Highest education level: Not on file  Occupational History  . Not on file  Social Needs  . Financial resource strain: Not on file  . Food insecurity:    Worry: Not on file    Inability: Not on file  . Transportation needs:    Medical: Not on file    Non-medical: Not on file  Tobacco Use  . Smoking status: Never Smoker  . Smokeless tobacco: Former Network engineer and Sexual Activity  . Alcohol use: Not Currently    Alcohol/week: 0.0 standard drinks    Comment: Socially   . Drug use: No  . Sexual activity: Yes    Partners: Male    Birth control/protection: Post-menopausal    Comment: Husband   Lifestyle  . Physical activity:    Days per week: Not on file    Minutes per session: Not on file  .  Stress: Not on file  Relationships  . Social connections:    Talks on phone: Not on file    Gets together: Not on file    Attends religious service: Not on file    Active member of club or organization: Not on file    Attends meetings of clubs or organizations: Not on file    Relationship status: Not on file  . Intimate partner violence:    Fear of current or ex partner: Not on file    Emotionally abused: Not on file    Physically abused: Not on file    Forced sexual activity: Not on file  Other Topics Concern  . Not on file  Social History Narrative   Married   Retired Orthoptist- 1 dog    Highest level of education- Bachelors degree    Retired    Caffeine- Coffee 1 cup in the morning, no tea or soda     Exercises- walks daily with goldendoodle.    Outpatient Medications Prior to Visit  Medication Sig  Dispense Refill  . valsartan (DIOVAN) 80 MG tablet Take 1 tablet (80 mg total) by mouth daily. 90 tablet 1  . AMBULATORY NON FORMULARY MEDICATION Medication Name: Estriol vaginal cream 1 mg/ml Insert 1 g vaginally once weekly 30 mL 0  . estradiol (ESTRACE) 0.1 MG/GM vaginal cream 0.5 g intravaginally 1-3 times per week. 42.5 g 0   No facility-administered medications prior to visit.      ROS:  Review of Systems  Constitutional: Negative for fatigue, fever and unexpected weight change.  Respiratory: Negative for cough, shortness of breath and wheezing.   Cardiovascular: Negative for chest pain, palpitations and leg swelling.  Gastrointestinal: Negative for blood in stool, constipation, diarrhea, nausea and vomiting.  Endocrine: Negative for cold intolerance, heat intolerance and polyuria.  Genitourinary: Negative for dyspareunia, dysuria, flank pain, frequency, genital sores, hematuria, menstrual problem, pelvic pain, urgency, vaginal bleeding, vaginal discharge and vaginal pain.  Musculoskeletal: Negative for back pain, joint swelling and myalgias.  Skin: Negative for rash.  Neurological: Negative for dizziness, syncope, light-headedness, numbness and headaches.  Hematological: Negative for adenopathy.  Psychiatric/Behavioral: Negative for agitation, confusion, sleep disturbance and suicidal ideas. The patient is not nervous/anxious.   BREAST: No symptoms   Objective: BP (!) 146/80   Pulse 70   Ht 5\' 6"  (1.676 m)   Wt 169 lb (76.7 kg)   BMI 27.28 kg/m    Physical Exam  Constitutional: She is oriented to person, place, and time. She appears well-developed and well-nourished.  Genitourinary: Vagina normal and uterus normal. There is no rash or tenderness on the right labia. There is no rash or tenderness on the left labia. No erythema or tenderness in the vagina. No vaginal discharge found. Right adnexum does not display mass and does not display tenderness. Left adnexum does not  display mass and does not display tenderness. Cervix does not exhibit motion tenderness or polyp. Uterus is not enlarged or tender.  Neck: Normal range of motion. No thyromegaly present.  Cardiovascular: Normal rate, regular rhythm and normal heart sounds.  No murmur heard. Pulmonary/Chest: Effort normal and breath sounds normal. Right breast exhibits no mass, no nipple discharge, no skin change and no tenderness. Left breast exhibits no mass, no nipple discharge, no skin change and no tenderness.  Abdominal: Soft. There is no tenderness. There is no guarding.  Musculoskeletal: Normal range of motion.  Neurological: She is alert and oriented  to person, place, and time. No cranial nerve deficit.  Psychiatric: She has a normal mood and affect. Her behavior is normal.  Vitals reviewed.   Assessment/Plan:  Encounter for annual routine gynecological examination  Cervical cancer screening - Plan: Cytology - PAP  Screening for breast cancer - Pt to sched mammo. Will co-sign when sched. Can't sign orders due to ABN form.   Postmenopausal atrophic vaginitis - Rx RF estriol vag crm. Faxed to [facility]. F/u prn - Plan: AMBULATORY NON FORMULARY MEDICATION  Dyspareunia in female - Plan: AMBULATORY NON FORMULARY MEDICATION   Meds ordered this encounter  Medications  . AMBULATORY NON FORMULARY MEDICATION    Sig: Medication Name: Estriol vaginal cream 1 mg/ml Insert 1 g vaginally once weekly    Dispense:  30 mL    Refill:  1    Order Specific Question:   Supervising Provider    Answer:   Gae Dry [[id number]]          GYN counsel breast self exam, mammography screening, menopause, adequate intake of calcium and vitamin D, diet and exercise    F/U  Return in about 2 years (around 67/07/4191) for annual.  Maram Bently B. Damien Cisar, PA-C 04/01/2018 9:34 AM

## 2018-04-06 ENCOUNTER — Other Ambulatory Visit (INDEPENDENT_AMBULATORY_CARE_PROVIDER_SITE_OTHER): Payer: Medicare Other

## 2018-04-06 DIAGNOSIS — R030 Elevated blood-pressure reading, without diagnosis of hypertension: Secondary | ICD-10-CM

## 2018-04-06 LAB — BASIC METABOLIC PANEL
BUN: 22 mg/dL (ref 6–23)
CHLORIDE: 107 meq/L (ref 96–112)
CO2: 26 mEq/L (ref 19–32)
Calcium: 10 mg/dL (ref 8.4–10.5)
Creatinine, Ser: 0.78 mg/dL (ref 0.40–1.20)
GFR: 77.35 mL/min (ref >60.00)
Glucose, Bld: 96 mg/dL (ref 70–99)
POTASSIUM: 4.4 meq/L (ref 3.5–5.1)
SODIUM: 141 meq/L (ref 135–145)

## 2018-04-07 LAB — CYTOLOGY - PAP
ADEQUACY: ABSENT
Diagnosis: NEGATIVE

## 2018-04-12 ENCOUNTER — Encounter: Payer: Self-pay | Admitting: Family

## 2018-04-13 ENCOUNTER — Other Ambulatory Visit: Payer: Self-pay

## 2018-04-13 DIAGNOSIS — R030 Elevated blood-pressure reading, without diagnosis of hypertension: Secondary | ICD-10-CM

## 2018-04-13 MED ORDER — VALSARTAN 80 MG PO TABS: 80.0000 mg | ORAL_TABLET | Freq: Every day | ORAL | 1 refills | Status: DC

## 2018-04-19 DIAGNOSIS — L578 Other skin changes due to chronic exposure to nonionizing radiation: Secondary | ICD-10-CM | POA: Diagnosis not present

## 2018-04-19 DIAGNOSIS — Z872 Personal history of diseases of the skin and subcutaneous tissue: Secondary | ICD-10-CM | POA: Diagnosis not present

## 2018-04-19 DIAGNOSIS — Z1283 Encounter for screening for malignant neoplasm of skin: Secondary | ICD-10-CM | POA: Diagnosis not present

## 2018-05-21 ENCOUNTER — Ambulatory Visit: Payer: Medicare Other

## 2018-05-24 ENCOUNTER — Encounter: Payer: Self-pay | Admitting: Family

## 2018-05-24 ENCOUNTER — Ambulatory Visit (INDEPENDENT_AMBULATORY_CARE_PROVIDER_SITE_OTHER): Payer: Medicare Other | Admitting: Family

## 2018-05-24 VITALS — BP 122/78 | HR 63 | Temp 97.9°F | Ht 66.0 in | Wt 172.4 lb

## 2018-05-24 DIAGNOSIS — I10 Essential (primary) hypertension: Secondary | ICD-10-CM | POA: Diagnosis not present

## 2018-05-24 DIAGNOSIS — E785 Hyperlipidemia, unspecified: Secondary | ICD-10-CM | POA: Diagnosis not present

## 2018-05-24 DIAGNOSIS — Z1239 Encounter for other screening for malignant neoplasm of breast: Secondary | ICD-10-CM | POA: Diagnosis not present

## 2018-05-24 LAB — LIPID PANEL
CHOL/HDL RATIO: 5
CHOLESTEROL: 256 mg/dL — AB (ref 0–200)
HDL: 52.9 mg/dL (ref >39.00)
LDL Cholesterol: 180 mg/dL — ABNORMAL HIGH (ref 0–99)
NonHDL: 202.98
TRIGLYCERIDES: 115 mg/dL (ref 0.0–149.0)
VLDL: 23 mg/dL (ref 0.0–40.0)

## 2018-05-24 LAB — COMPREHENSIVE METABOLIC PANEL
ALBUMIN: 4.4 g/dL (ref 3.5–5.2)
ALK PHOS: 74 U/L (ref 39–117)
ALT: 18 U/L (ref 0–35)
AST: 15 U/L (ref 0–37)
BUN: 24 mg/dL — ABNORMAL HIGH (ref 6–23)
CALCIUM: 9.4 mg/dL (ref 8.4–10.5)
CO2: 26 mEq/L (ref 19–32)
CREATININE: 0.77 mg/dL (ref 0.40–1.20)
Chloride: 108 mEq/L (ref 96–112)
GFR: 78.48 mL/min (ref >60.00)
Glucose, Bld: 92 mg/dL (ref 70–99)
Potassium: 4.3 mEq/L (ref 3.5–5.1)
Sodium: 141 mEq/L (ref 135–145)
TOTAL PROTEIN: 6.9 g/dL (ref 6.0–8.3)
Total Bilirubin: 0.5 mg/dL (ref 0.2–1.2)

## 2018-05-24 NOTE — Progress Notes (Signed)
Subjective:    Patient ID: Lynn Cochran, female    DOB: 04-26-1946, 72 y.o.   MRN: [id number]  CC: Lynn Cochran is a 72 y.o. female who presents today for follow up.   HPI: Feels well today, no complaints   HTN- Compliant with medication. Denies exertional chest pain or pressure, numbness or tingling radiating to left arm or jaw, palpitations, dizziness, frequent headaches, changes in vision, or shortness of breath.   HLD-treating with lifestyle, continue weight loss on Keto diet.          HISTORY:  Past Medical History:  Diagnosis Date  . Chicken pox   . Hyperlipidemia   . Postmenopausal atrophic vaginitis 11/15/2012   Past Surgical History:  Procedure Laterality Date  . APPENDECTOMY  1960  . AUGMENTATION MAMMAPLASTY Bilateral 1980's   implants  . AUGMENTATION MAMMAPLASTY Bilateral 2004   implants removed and mastopexi  . BRAIN SURGERY     Mastopexy  . EYE SURGERY Bilateral    trifocal lenses placed BL  . HAND SURGERY    . HAND SURGERY     Hemangioma Left Palm  . HYSTEROSCOPY W/D&C     polypectomy  . OTHER SURGICAL HISTORY     colonoscopy, diagnostic; 2003,2013 wnl;2013-[facility]-WNL, repeat 10 yrs   Family History  Problem Relation Age of Onset  . Heart disease Mother   . Alcohol abuse Mother   . Congestive Heart Failure Mother   . Hypertension Mother   . Sudden death Father 29       Car Accident  . Diabetes Paternal Grandmother   . Brain cancer Sister        glioblastoma  . Lung cancer Sister 7    Allergies: Paba derivatives Current Outpatient Medications on File Prior to Visit  Medication Sig Dispense Refill  . AMBULATORY NON FORMULARY MEDICATION Medication Name: Estriol vaginal cream 1 mg/ml Insert 1 g vaginally once weekly 30 mL 1  . valsartan (DIOVAN) 80 MG tablet Take 1 tablet (80 mg total) by mouth daily. 90 tablet 1   No current facility-administered medications on file prior to visit.     Social History   Tobacco  Use  . Smoking status: Never Smoker  . Smokeless tobacco: Former Network engineer Use Topics  . Alcohol use: Not Currently    Alcohol/week: 0.0 standard drinks    Comment: Socially   . Drug use: No    Review of Systems  Constitutional: Negative for chills and fever.  Respiratory: Negative for cough and shortness of breath.   Cardiovascular: Negative for chest pain and palpitations.  Gastrointestinal: Negative for nausea and vomiting.      Objective:    BP 122/78   Pulse 63   Temp 97.9 F (36.6 C) (Oral)   Ht 5\' 6"  (1.676 m)   Wt 172 lb 6.4 oz (78.2 kg)   SpO2 96%   BMI 27.83 kg/m  BP Readings from Last 3 Encounters:  05/24/18 122/78  04/01/18 (!) 146/80  03/08/18 130/78   Wt Readings from Last 3 Encounters:  05/24/18 172 lb 6.4 oz (78.2 kg)  04/01/18 169 lb (76.7 kg)  03/08/18 172 lb (78 kg)    Physical Exam Vitals signs reviewed.  Constitutional:      Appearance: She is well-developed.  Eyes:     Conjunctiva/sclera: Conjunctivae normal.  Cardiovascular:     Rate and Rhythm: Normal rate and regular rhythm.     Pulses: Normal pulses.     Heart sounds:  Normal heart sounds.  Pulmonary:     Effort: Pulmonary effort is normal.     Breath sounds: Normal breath sounds. No wheezing, rhonchi or rales.  Skin:    General: Skin is warm and dry.  Neurological:     Mental Status: She is alert.  Psychiatric:        Speech: Speech normal.        Behavior: Behavior normal.        Thought Content: Thought content normal.        Assessment & Plan:   Problem List Items Addressed This Visit      Cardiovascular and Mediastinum   HTN (hypertension) - Primary    Well-controlled, continue regimen.      Relevant Orders   Comprehensive metabolic panel     Other   Screening for breast cancer    Mammogram ordered, patient understands to schedule.      Relevant Orders   MM 3D SCREEN BREAST BILATERAL   HLD (hyperlipidemia)    Pending lipid panel      Relevant  Orders   Lipid panel       I am having Belia P. Dysart maintain her AMBULATORY NON FORMULARY MEDICATION and valsartan.   No orders of the defined types were placed in this encounter.   Return precautions given.   Risks, benefits, and alternatives of the medications and treatment plan prescribed today were discussed, and patient expressed understanding.   Education regarding symptom management and diagnosis given to patient on AVS.  Continue to follow with Burnard Hawthorne, FNP for routine health maintenance.   L[place] and I agreed with plan.   Mable Paris, FNP

## 2018-05-24 NOTE — Patient Instructions (Addendum)
Pleasure seeing you!   Managing Your Hypertension Hypertension is commonly called high blood pressure. This is when the force of your blood pressing against the walls of your arteries is too strong. Arteries are blood vessels that carry blood from your heart throughout your body. Hypertension forces the heart to work harder to pump blood, and may cause the arteries to become narrow or stiff. Having untreated or uncontrolled hypertension can cause heart attack, stroke, kidney disease, and other problems. What are blood pressure readings? A blood pressure reading consists of a higher number over a lower number. Ideally, your blood pressure should be below 120/80. The first ("top") number is called the systolic pressure. It is a measure of the pressure in your arteries as your heart beats. The second ("bottom") number is called the diastolic pressure. It is a measure of the pressure in your arteries as the heart relaxes. What does my blood pressure reading mean? Blood pressure is classified into four stages. Based on your blood pressure reading, your health care provider may use the following stages to determine what type of treatment you need, if any. Systolic pressure and diastolic pressure are measured in a unit called mm Hg. Normal  Systolic pressure: below 382.  Diastolic pressure: below 80. Elevated  Systolic pressure: 505-397.  Diastolic pressure: below 80. Hypertension stage 1  Systolic pressure: 673-419.  Diastolic pressure: 37-90. Hypertension stage 2  Systolic pressure: 240 or above.  Diastolic pressure: 90 or above. What health risks are associated with hypertension? Managing your hypertension is an important responsibility. Uncontrolled hypertension can lead to:  A heart attack.  A stroke.  A weakened blood vessel (aneurysm).  Heart failure.  Kidney damage.  Eye damage.  Metabolic syndrome.  Memory and concentration problems. What changes can I make to manage  my hypertension? Hypertension can be managed by making lifestyle changes and possibly by taking medicines. Your health care provider will help you make a plan to bring your blood pressure within a normal range. Eating and drinking   Eat a diet that is high in fiber and potassium, and low in salt (sodium), added sugar, and fat. An example eating plan is called the DASH (Dietary Approaches to Stop Hypertension) diet. To eat this way: ? Eat plenty of fresh fruits and vegetables. Try to fill half of your plate at each meal with fruits and vegetables. ? Eat whole grains, such as whole wheat pasta, brown rice, or whole grain bread. Fill about one quarter of your plate with whole grains. ? Eat low-fat diary products. ? Avoid fatty cuts of meat, processed or cured meats, and poultry with skin. Fill about one quarter of your plate with lean proteins such as fish, chicken without skin, beans, eggs, and tofu. ? Avoid premade and processed foods. These tend to be higher in sodium, added sugar, and fat.  Reduce your daily sodium intake. Most people with hypertension should eat less than 1,500 mg of sodium a day.  Limit alcohol intake to no more than 1 drink a day for nonpregnant women and 2 drinks a day for men. One drink equals 12 oz of beer, 5 oz of wine, or 1 oz of hard liquor. Lifestyle  Work with your health care provider to maintain a healthy body weight, or to lose weight. Ask what an ideal weight is for you.  Get at least 30 minutes of exercise that causes your heart to beat faster (aerobic exercise) most days of the week. Activities may include walking, swimming,  or biking.  Include exercise to strengthen your muscles (resistance exercise), such as weight lifting, as part of your weekly exercise routine. Try to do these types of exercises for 30 minutes at least 3 days a week.  Do not use any products that contain nicotine or tobacco, such as cigarettes and e-cigarettes. If you need help  quitting, ask your health care provider.  Control any long-term (chronic) conditions you have, such as high cholesterol or diabetes. Monitoring  Monitor your blood pressure at home as told by your health care provider. Your personal target blood pressure may vary depending on your medical conditions, your age, and other factors.  Have your blood pressure checked regularly, as often as told by your health care provider. Working with your health care provider  Review all the medicines you take with your health care provider because there may be side effects or interactions.  Talk with your health care provider about your diet, exercise habits, and other lifestyle factors that may be contributing to hypertension.  Visit your health care provider regularly. Your health care provider can help you create and adjust your plan for managing hypertension. Will I need medicine to control my blood pressure? Your health care provider may prescribe medicine if lifestyle changes are not enough to get your blood pressure under control, and if:  Your systolic blood pressure is 130 or higher.  Your diastolic blood pressure is 80 or higher. Take medicines only as told by your health care provider. Follow the directions carefully. Blood pressure medicines must be taken as prescribed. The medicine does not work as well when you skip doses. Skipping doses also puts you at risk for problems. Contact a health care provider if:  You think you are having a reaction to medicines you have taken.  You have repeated (recurrent) headaches.  You feel dizzy.  You have swelling in your ankles.  You have trouble with your vision. Get help right away if:  You develop a severe headache or confusion.  You have unusual weakness or numbness, or you feel faint.  You have severe pain in your chest or abdomen.  You vomit repeatedly.  You have trouble breathing. Summary  Hypertension is when the force of blood  pumping through your arteries is too strong. If this condition is not controlled, it may put you at risk for serious complications.  Your personal target blood pressure may vary depending on your medical conditions, your age, and other factors. For most people, a normal blood pressure is less than 120/80.  Hypertension is managed by lifestyle changes, medicines, or both. Lifestyle changes include weight loss, eating a healthy, low-sodium diet, exercising more, and limiting alcohol. This information is not intended to replace advice given to you by your health care provider. Make sure you discuss any questions you have with your health care provider. Document Released: 02/04/2012 Document Revised: 04/09/2016 Document Reviewed: 04/09/2016 Elsevier Interactive Patient Education  2019 [employer].

## 2018-05-24 NOTE — Assessment & Plan Note (Signed)
Mammogram ordered, patient understands to schedule.

## 2018-05-24 NOTE — Assessment & Plan Note (Signed)
Well controlled, continue regimen 

## 2018-05-24 NOTE — Assessment & Plan Note (Signed)
Pending lipid panel 

## 2018-06-02 ENCOUNTER — Other Ambulatory Visit: Payer: Self-pay | Admitting: Internal Medicine

## 2018-06-14 ENCOUNTER — Encounter: Payer: Self-pay | Admitting: Family

## 2018-07-13 ENCOUNTER — Ambulatory Visit
Admission: RE | Admit: 2018-07-13 | Discharge: 2018-07-13 | Disposition: A | Discharge status: Home / Self Care | Payer: Medicare Other | Source: Ambulatory Visit | Attending: Family | Admitting: Family

## 2018-07-13 DIAGNOSIS — Z1239 Encounter for other screening for malignant neoplasm of breast: Secondary | ICD-10-CM

## 2018-07-13 DIAGNOSIS — Z1231 Encounter for screening mammogram for malignant neoplasm of breast: Secondary | ICD-10-CM | POA: Diagnosis not present

## 2018-07-19 ENCOUNTER — Encounter: Payer: Self-pay | Admitting: Obstetrics and Gynecology

## 2018-07-19 ENCOUNTER — Other Ambulatory Visit: Payer: Self-pay | Admitting: Obstetrics and Gynecology

## 2018-07-19 DIAGNOSIS — N941 Unspecified dyspareunia: Secondary | ICD-10-CM

## 2018-07-19 DIAGNOSIS — N952 Postmenopausal atrophic vaginitis: Secondary | ICD-10-CM

## 2018-07-19 MED ORDER — AMBULATORY NON FORMULARY MEDICATION: 1 refills | Status: DC

## 2018-07-19 NOTE — Progress Notes (Signed)
Refax since pharm doesn't have it from 11/19 appt.

## 2018-08-10 ENCOUNTER — Encounter: Payer: Self-pay | Admitting: Family

## 2018-08-11 ENCOUNTER — Other Ambulatory Visit: Payer: Self-pay | Admitting: Family

## 2018-08-11 DIAGNOSIS — B001 Herpesviral vesicular dermatitis: Secondary | ICD-10-CM

## 2018-08-11 MED ORDER — VALACYCLOVIR HCL 1 G PO TABS: ORAL_TABLET | ORAL | 3 refills | Status: DC

## 2018-10-21 ENCOUNTER — Other Ambulatory Visit: Payer: Self-pay

## 2018-10-21 ENCOUNTER — Ambulatory Visit (INDEPENDENT_AMBULATORY_CARE_PROVIDER_SITE_OTHER): Payer: Medicare Other

## 2018-10-21 ENCOUNTER — Telehealth: Payer: Self-pay | Admitting: *Deleted

## 2018-10-21 DIAGNOSIS — Z0000 Encounter for general adult medical examination without abnormal findings: Secondary | ICD-10-CM | POA: Diagnosis not present

## 2018-10-21 NOTE — Telephone Encounter (Signed)
Copied from [place] [id number]. Topic: General - Other >> Oct 21, 2018 10:05 AM Virl Axe D wrote: Reason for CRM: Pt called and stated she is not at home and needs to be called on her mobile number for her appt this morning. Number updated in appt notes.

## 2018-10-21 NOTE — Progress Notes (Signed)
Subjective:   Lynn Cochran is a 73 y.o. female who presents for Medicare Annual (Subsequent) preventive examination.  Review of Systems:  No ROS.  Medicare Wellness Virtual Visit.  Visual/audio telehealth visit, UTA vital signs.   See social history for additional risk factors.  Cardiac Risk Factors include: advanced age (>54mn, >>76women);hypertension     Objective:     Vitals: There were no vitals taken for this visit.  There is no height or weight on file to calculate BMI.  Advanced Directives 10/21/2018 05/20/2017  Does Patient Have a Medical Advance Directive? Yes Yes  Type of AParamedicof AOdinLiving will Living will;Healthcare Power of Attorney  Does patient want to make changes to medical advance directive? No - Patient declined No - Patient declined  Copy of [place] Chart? No - copy requested No - copy requested    Tobacco Social History   Tobacco Use  Smoking Status Never Smoker  Smokeless Tobacco Former UEngineer, structuralgiven: Not Answered   Clinical Intake:  Pre-visit preparation completed: Yes           How often do you need to have someone help you when you read instructions, pamphlets, or other written materials from your doctor or pharmacy?: 1 - Never  Interpreter Needed?: No     Past Medical History:  Diagnosis Date  . Chicken pox   . Hyperlipidemia   . Postmenopausal atrophic vaginitis 11/15/2012   Past Surgical History:  Procedure Laterality Date  . APPENDECTOMY  1960  . AUGMENTATION MAMMAPLASTY Bilateral 1980's   implants  . AUGMENTATION MAMMAPLASTY Bilateral 2004   implants removed and mastopexi  . BRAIN SURGERY     Mastopexy  . EYE SURGERY Bilateral    trifocal lenses placed BL  . HAND SURGERY    . HAND SURGERY     Hemangioma Left Palm  . HYSTEROSCOPY W/D&C     polypectomy  . OTHER SURGICAL HISTORY     colonoscopy, diagnostic; 2003,2013  wnl;2013-[facility]-WNL, repeat 10 yrs   Family History  Problem Relation Age of Onset  . Heart disease Mother   . Alcohol abuse Mother   . Congestive Heart Failure Mother   . Hypertension Mother   . Sudden death Father 449      Car Accident  . Diabetes Paternal Grandmother   . Brain cancer Sister        glioblastoma  . Lung cancer Sister 630 . Breast cancer Neg Hx    Social History   Socioeconomic History  . Marital status: Married    Spouse name: Not on file  . Number of children: Not on file  . Years of education: Not on file  . Highest education level: Not on file  Occupational History  . Not on file  Social Needs  . Financial resource strain: Not on file  . Food insecurity:    Worry: Never true    Inability: Never true  . Transportation needs:    Medical: No    Non-medical: No  Tobacco Use  . Smoking status: Never Smoker  . Smokeless tobacco: Former UNetwork engineerand Sexual Activity  . Alcohol use: Not Currently    Alcohol/week: 0.0 standard drinks    Comment: Socially   . Drug use: No  . Sexual activity: Yes    Partners: Male    Birth control/protection: Post-menopausal    Comment: Husband   Lifestyle  .  Physical activity:    Days per week: 7 days    Minutes per session: 90 min  . Stress: Not at all  Relationships  . Social connections:    Talks on phone: Not on file    Gets together: Not on file    Attends religious service: Not on file    Active member of club or organization: Not on file    Attends meetings of clubs or organizations: Not on file    Relationship status: Not on file  Other Topics Concern  . Not on file  Social History Narrative   Married   Retired Orthoptist- 1 dog    Highest level of education- Bachelors degree    Retired    Caffeine- Coffee 1 cup in the morning, no tea or soda     Exercises- walks daily with goldendoodle.    Outpatient Encounter Medications as of 10/21/2018  Medication Sig  . AMBULATORY  NON FORMULARY MEDICATION Medication Name: Estriol vaginal cream 1 mg/ml Insert 1 g vaginally once weekly  . valACYclovir (VALTREX) 1000 MG tablet Take two tablets ( total 2000 mg) by mouth q12h x 1 day; Start: ASAP after symptom onset  . valsartan (DIOVAN) 40 MG tablet TAKE 2 TABLETS BY MOUTH EVERY DAY  . valsartan (DIOVAN) 80 MG tablet Take 1 tablet (80 mg total) by mouth daily.   No facility-administered encounter medications on file as of 10/21/2018.     Activities of Daily Living In your present state of health, do you have any difficulty performing the following activities: 10/21/2018  Hearing? Y  Comment L ear hearing aid  Vision? N  Difficulty concentrating or making decisions? N  Walking or climbing stairs? N  Dressing or bathing? N  Doing errands, shopping? N  Preparing Food and eating ? N  Using the Toilet? N  In the past six months, have you accidently leaked urine? N  Do you have problems with loss of bowel control? N  Managing your Medications? N  Managing your Finances? N  Housekeeping or managing your Housekeeping? N  Some recent data might be hidden    Patient Care Team: Burnard Hawthorne, FNP as PCP - General (Family Medicine)    Assessment:   This is a routine wellness examination for Lynn Cochran.  I connected with patient 10/21/18 at 10:00 AM EDT by a video/audio enabled telemedicine application and verified that I am speaking with the correct person using two identifiers. Patient stated full name and DOB. Patient gave permission to continue with virtual visit. Patient's location was at home and Nurse's location was at [place] office.   Patient notes she is taking her spot blood pressure and it has been within normal range.Reports average 115/70 P64  Health Screenings  Mammogram - 06/2018 Colonoscopy - 05/2011 Bone Density - 04/2016 Glaucoma -none Hearing -L ear hearing aid  Hemoglobin A1C - 04/2017 Cholesterol - 04/2018 Dental- every 6 months Vision-  visits within the last 12 months.  Social  Alcohol intake - no         Smoking history- never    Smokeless tobacco -former user Smokers in home? none Illicit drug use? none Exercise - walking 2 miles daily Diet - low carb Sexually Active -yes BMI- discussed the importance of a healthy diet, water intake and the benefits of aerobic exercise.  Educational material provided.   Safety  Patient feels safe at home- yes Patient does have smoke detectors at home- yes  Patient does wear sunscreen or protective clothing when in direct sunlight -yes Patient does wear seat belt when in a moving vehicle -yes  Covid-19 precautions and sickness symptoms discussed.   Activities of Daily Living Patient denies needing assistance with: driving, household chores, feeding themselves, getting from bed to chair, getting to the toilet, bathing/showering, dressing, managing money, or preparing meals.  No new identified risk were noted.    Depression Screen Patient denies losing interest in daily life, feeling hopeless, or crying easily over simple problems.   Medication-taking as directed and without issues.   Fall Screen Patient denies being afraid of falling or falling in the last year.   Memory Screen Patient is alert.  Patient denies difficulty focusing, concentrating or misplacing items. Correctly identified the president of the [place], season and recall. Patient likes to read, plays computer games, and work puzzles for brain stimulation.  Immunizations The following Immunizations were discussed: Influenza, shingles, pneumonia, and tetanus.   Other Providers Patient Care Team: Burnard Hawthorne, FNP as PCP - General (Family Medicine)  Exercise Activities and Dietary recommendations Current Exercise Habits: Home exercise routine, Type of exercise: walking;stretching, Time (Minutes): 60, Frequency (Times/Week): 7, Weekly Exercise (Minutes/Week): 420, Intensity: Intense  Goals      Patient  Stated   . Weight (lb) < 170 lb (77.1 kg) (pt-stated)     Weight goal is 140lb        Fall Risk Fall Risk  10/21/2018 05/20/2017 03/02/2015  Falls in the past year? 0 No Yes  Number falls in past yr: - - 1  Injury with Fall? - - Yes  Risk Factor Category  - - High Fall Risk  Follow up - - Falls prevention discussed   Depression Screen PHQ 2/9 Scores 10/21/2018 05/20/2017 03/02/2015  PHQ - 2 Score 0 0 0     Cognitive Function MMSE - Mini Mental State Exam 05/20/2017  Not completed: Refused     6CIT Screen 10/21/2018  What Year? 0 points  What month? 0 points  What time? 0 points  Count back from 20 0 points  Months in reverse 0 points  Repeat phrase 0 points  Total Score 0    Immunization History  Administered Date(s) Administered  . Influenza, High Dose Seasonal PF 02/18/2016, 02/25/2018  . Influenza,inj,Quad PF,6+ Mos 03/02/2015  . Influenza-Unspecified 03/02/2015, 02/18/2016, 03/16/2017  . MMR 03/23/2018  . Pneumococcal Conjugate-13 04/01/2016  . Pneumococcal Polysaccharide-23 05/20/2017  . Td 10/30/2008   Screening Tests Health Maintenance  Topic Date Due  . TETANUS/TDAP  10/31/2018  . INFLUENZA VACCINE  12/25/2018  . MAMMOGRAM  07/14/2019  . COLONOSCOPY  05/26/2021  . DEXA SCAN  Completed  . Hepatitis C Screening  Completed  . PNA vac Low Risk Adult  Completed      Plan:    End of life planning; Advance aging; Advanced directives discussed.  Copy of current HCPOA/Living Will requested.    I have personally reviewed and noted the following in the patient's chart:   . Medical and social history . Use of alcohol, tobacco or illicit drugs  . Current medications and supplements . Functional ability and status . Nutritional status . Physical activity . Advanced directives . List of other physicians . Hospitalizations, surgeries, and ER visits in previous 12 months . Vitals . Screenings to include cognitive, depression, and falls . Referrals and  appointments  In addition, I have reviewed and discussed with patient certain preventive protocols, quality metrics, and best practice recommendations.  A written personalized care plan for preventive services as well as general preventive health recommendations were provided to patient.     Varney Biles, LPN  8/36/7255    Agree with plan. Mable Paris, NP

## 2018-10-21 NOTE — Patient Instructions (Addendum)
  Lynn Cochran , Thank you for taking time to come for your Medicare Wellness Visit. I appreciate your ongoing commitment to your health goals. Please review the following plan we discussed and let me know if I can assist you in the future.   These are the goals we discussed: Goals      Patient Stated   . Weight (lb) < 170 lb (77.1 kg) (pt-stated)     Weight goal is 140lb        This is a list of the screening recommended for you and due dates:  Health Maintenance  Topic Date Due  . Tetanus Vaccine  10/31/2018  . Flu Shot  12/25/2018  . Mammogram  07/14/2019  . Colon Cancer Screening  05/26/2021  . DEXA scan (bone density measurement)  Completed  .  Hepatitis C: One time screening is recommended by Center for Disease Control  (CDC) for  adults born from [birth year] through 1964.   Completed  . Pneumonia vaccines  Completed

## 2018-11-02 ENCOUNTER — Other Ambulatory Visit: Payer: Self-pay | Admitting: Internal Medicine

## 2018-11-13 ENCOUNTER — Other Ambulatory Visit: Payer: Self-pay | Admitting: Internal Medicine

## 2018-11-13 DIAGNOSIS — R030 Elevated blood-pressure reading, without diagnosis of hypertension: Secondary | ICD-10-CM

## 2018-11-22 ENCOUNTER — Other Ambulatory Visit: Payer: Self-pay

## 2018-11-24 ENCOUNTER — Ambulatory Visit: Payer: Medicare Other | Admitting: Family

## 2018-12-21 ENCOUNTER — Encounter: Payer: Self-pay | Admitting: Family

## 2018-12-21 ENCOUNTER — Telehealth: Payer: Self-pay | Admitting: Family

## 2018-12-21 NOTE — Telephone Encounter (Signed)
Copied from [place] [id number]. Topic: General - Other >> Dec 21, 2018  4:04 PM Keene Breath wrote: Reason for CRM: Patient called to request the nurse to call her regarding an allergic reaction she thinks is because of her medication.  Patient has a rash on her shoulder and abdomen.  Please advise and call patient back at [phone]

## 2018-12-21 NOTE — Telephone Encounter (Signed)
Called and spoke to pt.  Pt said that she is having a rash that itches badly.  No other symptoms.  No swollen tongue, no breathing problems.  Pt said that she is taking benadryl for rash.  Pt said that she has an appt scheduled w/ dermatology tomorrow.    Pt said that the only thing that she is doing differently is that she was not able to get a refill of 80 mg of the valsartan tablet because it wasn't in stock at the pharmacy and had to start taking two 40 mg of valsartan.  Pt said that's when the rash appeared.    Pt would like to go back to the 80 mg tabs and is requesting for PCP to call back in refill Rx for the 80 mg tabs.

## 2018-12-22 ENCOUNTER — Other Ambulatory Visit: Payer: Self-pay

## 2018-12-22 DIAGNOSIS — R030 Elevated blood-pressure reading, without diagnosis of hypertension: Secondary | ICD-10-CM

## 2018-12-22 DIAGNOSIS — L2389 Allergic contact dermatitis due to other agents: Secondary | ICD-10-CM | POA: Diagnosis not present

## 2018-12-22 MED ORDER — VALSARTAN 80 MG PO TABS: 80.0000 mg | ORAL_TABLET | Freq: Every day | ORAL | 1 refills | Status: DC

## 2018-12-22 NOTE — Telephone Encounter (Signed)
Noted! Thank you

## 2019-01-06 ENCOUNTER — Encounter: Payer: Self-pay | Admitting: Family

## 2019-01-25 ENCOUNTER — Other Ambulatory Visit: Payer: Self-pay | Admitting: Family

## 2019-01-25 ENCOUNTER — Other Ambulatory Visit: Payer: Self-pay

## 2019-01-25 DIAGNOSIS — R030 Elevated blood-pressure reading, without diagnosis of hypertension: Secondary | ICD-10-CM

## 2019-02-11 ENCOUNTER — Encounter: Payer: Self-pay | Admitting: Family

## 2019-02-11 DIAGNOSIS — Z23 Encounter for immunization: Secondary | ICD-10-CM | POA: Diagnosis not present

## 2019-03-23 ENCOUNTER — Encounter: Payer: Self-pay | Admitting: Obstetrics and Gynecology

## 2019-04-25 ENCOUNTER — Encounter: Payer: Self-pay | Admitting: Family

## 2019-04-25 DIAGNOSIS — Z872 Personal history of diseases of the skin and subcutaneous tissue: Secondary | ICD-10-CM | POA: Diagnosis not present

## 2019-04-25 DIAGNOSIS — L578 Other skin changes due to chronic exposure to nonionizing radiation: Secondary | ICD-10-CM | POA: Diagnosis not present

## 2019-04-25 DIAGNOSIS — L570 Actinic keratosis: Secondary | ICD-10-CM | POA: Diagnosis not present

## 2019-06-19 ENCOUNTER — Other Ambulatory Visit: Payer: Self-pay | Admitting: Family

## 2019-06-19 DIAGNOSIS — R030 Elevated blood-pressure reading, without diagnosis of hypertension: Secondary | ICD-10-CM

## 2019-06-20 ENCOUNTER — Encounter: Payer: Self-pay | Admitting: Family

## 2019-06-20 ENCOUNTER — Other Ambulatory Visit: Payer: Self-pay | Admitting: Family

## 2019-06-20 DIAGNOSIS — Z1231 Encounter for screening mammogram for malignant neoplasm of breast: Secondary | ICD-10-CM

## 2019-06-21 ENCOUNTER — Other Ambulatory Visit: Payer: Self-pay | Admitting: Lab

## 2019-06-21 DIAGNOSIS — R030 Elevated blood-pressure reading, without diagnosis of hypertension: Secondary | ICD-10-CM

## 2019-06-21 MED ORDER — VALSARTAN 80 MG PO TABS: 80.0000 mg | ORAL_TABLET | Freq: Every day | ORAL | 1 refills | Status: DC

## 2019-06-21 NOTE — Telephone Encounter (Signed)
Called Pt and scheduled a lab appt for 07/05/19 and a F/U appt 07/12/19. I also sent the Pt in a refill for the Volsartin.

## 2019-06-28 ENCOUNTER — Other Ambulatory Visit: Payer: Self-pay

## 2019-07-01 ENCOUNTER — Telehealth: Payer: Self-pay | Admitting: *Deleted

## 2019-07-01 DIAGNOSIS — I10 Essential (primary) hypertension: Secondary | ICD-10-CM

## 2019-07-01 NOTE — Telephone Encounter (Signed)
Please place future orders for lab appt.  

## 2019-07-05 ENCOUNTER — Other Ambulatory Visit (INDEPENDENT_AMBULATORY_CARE_PROVIDER_SITE_OTHER): Payer: Medicare Other

## 2019-07-05 ENCOUNTER — Other Ambulatory Visit: Payer: Self-pay

## 2019-07-05 DIAGNOSIS — I10 Essential (primary) hypertension: Secondary | ICD-10-CM

## 2019-07-05 LAB — LIPID PANEL
Cholesterol: 293 mg/dL — ABNORMAL HIGH (ref 0–200)
HDL: 54.5 mg/dL (ref >39.00)
LDL Cholesterol: 201 mg/dL — ABNORMAL HIGH (ref 0–99)
NonHDL: 238.43
Total CHOL/HDL Ratio: 5
Triglycerides: 186 mg/dL — ABNORMAL HIGH (ref 0.0–149.0)
VLDL: 37.2 mg/dL (ref 0.0–40.0)

## 2019-07-05 LAB — COMPREHENSIVE METABOLIC PANEL
ALT: 21 U/L (ref 0–35)
AST: 19 U/L (ref 0–37)
Albumin: 4.6 g/dL (ref 3.5–5.2)
Alkaline Phosphatase: 77 U/L (ref 39–117)
BUN: 22 mg/dL (ref 6–23)
CO2: 28 mEq/L (ref 19–32)
Calcium: 10.2 mg/dL (ref 8.4–10.5)
Chloride: 106 mEq/L (ref 96–112)
Creatinine, Ser: 0.88 mg/dL (ref 0.40–1.20)
GFR: 63.1 mL/min (ref >60.00)
Glucose, Bld: 102 mg/dL — ABNORMAL HIGH (ref 70–99)
Potassium: 4.2 mEq/L (ref 3.5–5.1)
Sodium: 141 mEq/L (ref 135–145)
Total Bilirubin: 0.7 mg/dL (ref 0.2–1.2)
Total Protein: 7.4 g/dL (ref 6.0–8.3)

## 2019-07-07 ENCOUNTER — Encounter: Payer: Self-pay | Admitting: Family

## 2019-07-07 ENCOUNTER — Other Ambulatory Visit: Payer: Self-pay

## 2019-07-08 ENCOUNTER — Other Ambulatory Visit: Payer: Self-pay | Admitting: Family

## 2019-07-08 DIAGNOSIS — E785 Hyperlipidemia, unspecified: Secondary | ICD-10-CM

## 2019-07-08 MED ORDER — EZETIMIBE 10 MG PO TABS: 10.0000 mg | ORAL_TABLET | Freq: Every day | ORAL | 3 refills | Status: DC

## 2019-07-12 ENCOUNTER — Ambulatory Visit (INDEPENDENT_AMBULATORY_CARE_PROVIDER_SITE_OTHER): Payer: Medicare Other | Admitting: Family

## 2019-07-12 ENCOUNTER — Encounter: Payer: Self-pay | Admitting: Family

## 2019-07-12 ENCOUNTER — Other Ambulatory Visit: Payer: Self-pay

## 2019-07-12 VITALS — BP 130/90 | HR 70 | Temp 97.7°F | Ht 66.0 in | Wt 179.0 lb

## 2019-07-12 DIAGNOSIS — I10 Essential (primary) hypertension: Secondary | ICD-10-CM | POA: Diagnosis not present

## 2019-07-12 DIAGNOSIS — E663 Overweight: Secondary | ICD-10-CM | POA: Diagnosis not present

## 2019-07-12 DIAGNOSIS — R635 Abnormal weight gain: Secondary | ICD-10-CM | POA: Diagnosis not present

## 2019-07-12 DIAGNOSIS — E785 Hyperlipidemia, unspecified: Secondary | ICD-10-CM

## 2019-07-12 DIAGNOSIS — G629 Polyneuropathy, unspecified: Secondary | ICD-10-CM | POA: Diagnosis not present

## 2019-07-12 LAB — TSH: TSH: 1.98 u[IU]/mL (ref 0.35–4.50)

## 2019-07-12 NOTE — Assessment & Plan Note (Signed)
Started on Zetia, advised that we recheck cholesterol in 3 to 6 months.

## 2019-07-12 NOTE — Assessment & Plan Note (Signed)
Improved with patient resting room however concerned that blood pressure may be running a little above goal.  Advised patient to keep blood pressure log at home so we can safely and properly adjust medications as needed.  Patient verbalized understanding and agreed with plan

## 2019-07-12 NOTE — Patient Instructions (Signed)
Always nice to see you.  Let me know your thoughts on Wellbutrin and also referral to neurology at any point.  Monitor blood pressure,  Goal is less than 120/80, based on newest guidelines; if persistently higher, please make sooner follow up appointment so we can recheck you blood pressure and manage medications  Stay safe!

## 2019-07-12 NOTE — Assessment & Plan Note (Signed)
Chronic for 30 years.  Reassured as the symptom has been unchanged.  Advised referral to neurology, patient politely declined.  She will let me know if she reconsiders

## 2019-07-12 NOTE — Progress Notes (Signed)
Subjective:    Patient ID: Lynn Cochran, female    DOB: Apr 03, 1946, 74 y.o.   MRN: [id number]  CC: Lynn Cochran is a 74 y.o. female who presents today for follow up.   HPI: Follow-up today  Frustrated by weight.  Over the past year, she has changed her diet to a higher protein, lower carbohydrate diet.  She feels better altogether;  no longer needing  Pepcid AC however she is frustrated that she continues to gain weight.  She is walking 4 miles a day.  No chest pain or shortness of breath.  No heavy alcohol use.  She may have an occasional small glass of wine.  No history of seizure or eating disorder.  She also states that she continues to have bilateral feet numbness ; this is been ongoing for 30 years and is unchanged.  She has no wounds in her feet.  This symptom has not been progressive. No falls.   HTN-compliant with medication.  At home blood pressure running around 135/78.  HLD-intolerant to statins, has started zetia.   HISTORY:  Past Medical History:  Diagnosis Date  . Chicken pox   . Hyperlipidemia   . Postmenopausal atrophic vaginitis 11/15/2012   Past Surgical History:  Procedure Laterality Date  . APPENDECTOMY  1960  . AUGMENTATION MAMMAPLASTY Bilateral 1980's   implants  . AUGMENTATION MAMMAPLASTY Bilateral 2004   implants removed and mastopexi  . BRAIN SURGERY     Mastopexy  . EYE SURGERY Bilateral    trifocal lenses placed BL  . HAND SURGERY    . HAND SURGERY     Hemangioma Left Palm  . HYSTEROSCOPY WITH D & C     polypectomy  . OTHER SURGICAL HISTORY     colonoscopy, diagnostic; 2003,2013 wnl;2013-Elliott-WNL, repeat 10 yrs   Family History  Problem Relation Age of Onset  . Heart disease Mother   . Alcohol abuse Mother   . Congestive Heart Failure Mother   . Hypertension Mother   . Sudden death Father 18       Car Accident  . Diabetes Paternal Grandmother   . Brain cancer Sister        glioblastoma  . Lung cancer Sister 25    . Breast cancer Neg Hx     Allergies: Paba derivatives Current Outpatient Medications on File Prior to Visit  Medication Sig Dispense Refill  . AMBULATORY NON FORMULARY MEDICATION Medication Name: Estriol vaginal cream 1 mg/ml Insert 1 g vaginally once weekly 30 mL 1  . ezetimibe (ZETIA) 10 MG tablet Take 1 tablet (10 mg total) by mouth daily. 90 tablet 3  . valsartan (DIOVAN) 80 MG tablet Take 1 tablet (80 mg total) by mouth daily. 90 tablet 1   No current facility-administered medications on file prior to visit.    Social History   Tobacco Use  . Smoking status: Never Smoker  . Smokeless tobacco: Former Network engineer Use Topics  . Alcohol use: Not Currently    Alcohol/week: 0.0 standard drinks    Comment: Socially   . Drug use: No    Review of Systems  Constitutional: Negative for chills and fever.  Respiratory: Negative for cough.   Cardiovascular: Negative for chest pain and palpitations.  Gastrointestinal: Negative for nausea and vomiting.      Objective:    BP 130/90   Pulse 70   Temp 97.7 F (36.5 C)   Ht 5\' 6"  (1.676 m)   Wt 179 lb (  81.2 kg)   SpO2 97%   BMI 28.89 kg/m  BP Readings from Last 3 Encounters:  07/12/19 130/90  05/24/18 122/78  04/01/18 (!) 146/80   Wt Readings from Last 3 Encounters:  07/12/19 179 lb (81.2 kg)  05/24/18 172 lb 6.4 oz (78.2 kg)  04/01/18 169 lb (76.7 kg)    Physical Exam Vitals reviewed.  Constitutional:      Appearance: She is well-developed.  Eyes:     Conjunctiva/sclera: Conjunctivae normal.  Cardiovascular:     Rate and Rhythm: Normal rate and regular rhythm.     Pulses: Normal pulses.     Heart sounds: Normal heart sounds.  Pulmonary:     Effort: Pulmonary effort is normal.     Breath sounds: Normal breath sounds. No wheezing, rhonchi or rales.  Skin:    General: Skin is warm and dry.  Neurological:     Mental Status: She is alert.  Psychiatric:        Speech: Speech normal.        Behavior:  Behavior normal.        Thought Content: Thought content normal.    Wt Readings from Last 3 Encounters:  07/12/19 179 lb (81.2 kg)  05/24/18 172 lb 6.4 oz (78.2 kg)  04/01/18 169 lb (76.7 kg)   Body mass index is 28.89 kg/m.     Assessment & Plan:   Problem List Items Addressed This Visit      Cardiovascular and Mediastinum   HTN (hypertension)    Improved with patient resting room however concerned that blood pressure may be running a little above goal.  Advised patient to keep blood pressure log at home so we can safely and properly adjust medications as needed.  Patient verbalized understanding and agreed with plan        Nervous and Auditory   Neuropathy    Chronic for 30 years.  Reassured as the symptom has been unchanged.  Advised referral to neurology, patient politely declined.  She will let me know if she reconsiders        Other   HLD (hyperlipidemia)    Started on Zetia, advised that we recheck cholesterol in 3 to 6 months.      Overweight    Pressures about weight gain despite making very good lifestyle choices and following a higher protein diet, less carbohydrates.  Pending thyroid check today.  We discussed Wellbutrin, patient will consider this and let me know.       Relevant Orders   TSH    Other Visit Diagnoses    Weight gain    -  Primary   Relevant Orders   TSH       I have discontinued Laneta P. Shave's valACYclovir. I am also having her maintain her AMBULATORY NON FORMULARY MEDICATION, valsartan, and ezetimibe.   No orders of the defined types were placed in this encounter.   Return precautions given.   Risks, benefits, and alternatives of the medications and treatment plan prescribed today were discussed, and patient expressed understanding.   Education regarding symptom management and diagnosis given to patient on AVS.  Continue to follow with Burnard Hawthorne, FNP for routine health maintenance.   Lubertha South Satter  and I agreed with plan.   Mable Paris, FNP

## 2019-07-12 NOTE — Assessment & Plan Note (Signed)
Pressures about weight gain despite making very good lifestyle choices and following a higher protein diet, less carbohydrates.  Pending thyroid check today.  We discussed Wellbutrin, patient will consider this and let me know.

## 2019-07-18 ENCOUNTER — Other Ambulatory Visit: Payer: Self-pay

## 2019-07-18 ENCOUNTER — Ambulatory Visit
Admission: RE | Admit: 2019-07-18 | Discharge: 2019-07-18 | Disposition: A | Discharge status: Home / Self Care | Payer: Medicare Other | Source: Ambulatory Visit | Attending: Family | Admitting: Family

## 2019-07-18 DIAGNOSIS — Z1231 Encounter for screening mammogram for malignant neoplasm of breast: Secondary | ICD-10-CM | POA: Insufficient documentation

## 2019-10-25 ENCOUNTER — Ambulatory Visit: Payer: Medicare Other

## 2019-12-12 DIAGNOSIS — Z961 Presence of intraocular lens: Secondary | ICD-10-CM | POA: Diagnosis not present

## 2019-12-12 DIAGNOSIS — H532 Diplopia: Secondary | ICD-10-CM | POA: Diagnosis not present

## 2019-12-20 ENCOUNTER — Other Ambulatory Visit: Payer: Self-pay | Admitting: Family

## 2019-12-20 DIAGNOSIS — R030 Elevated blood-pressure reading, without diagnosis of hypertension: Secondary | ICD-10-CM

## 2019-12-22 DIAGNOSIS — Z79899 Other long term (current) drug therapy: Secondary | ICD-10-CM | POA: Diagnosis not present

## 2019-12-22 DIAGNOSIS — E7800 Pure hypercholesterolemia, unspecified: Secondary | ICD-10-CM | POA: Diagnosis not present

## 2019-12-22 DIAGNOSIS — I10 Essential (primary) hypertension: Secondary | ICD-10-CM | POA: Diagnosis not present

## 2020-01-23 ENCOUNTER — Telehealth: Payer: Self-pay | Admitting: Family

## 2020-01-23 NOTE — Telephone Encounter (Signed)
Copied from [place] [phone]. Topic: Medicare AWV >> Jan 23, 2020  2:14 PM Cher Nakai R wrote: Reason for CRM:  Left message for patient to call back and schedule Medicare Annual Wellness Visit (AWV) either virtually or audio only.  Last AWV 10/21/2018  Please schedule at anytime with Denisa O'Brien-Blaney at [facility]

## 2020-01-27 DIAGNOSIS — Z23 Encounter for immunization: Secondary | ICD-10-CM | POA: Diagnosis not present

## 2020-06-21 ENCOUNTER — Telehealth: Payer: Self-pay | Admitting: Family

## 2020-06-21 NOTE — Telephone Encounter (Signed)
Left message for patient to call back and schedule Medicare Annual Wellness Visit (AWV)   This should be a virtual visit only=30 minutes.  Last AWV 10/21/18; please schedule at anytime with Denisa O'Brien-Blaney at [facility].

## 2022-06-20 LAB — COMPREHENSIVE METABOLIC PANEL: EGFR: 64

## 2022-12-17 ENCOUNTER — Encounter: Payer: Self-pay | Admitting: Family

## 2022-12-17 ENCOUNTER — Ambulatory Visit: Payer: Medicare PPO | Admitting: Family

## 2022-12-17 VITALS — BP 131/70 | HR 72 | Temp 98.0°F | Ht 66.0 in | Wt 171.2 lb

## 2022-12-17 DIAGNOSIS — Z1231 Encounter for screening mammogram for malignant neoplasm of breast: Secondary | ICD-10-CM

## 2022-12-17 DIAGNOSIS — I10 Essential (primary) hypertension: Secondary | ICD-10-CM

## 2022-12-17 DIAGNOSIS — E785 Hyperlipidemia, unspecified: Secondary | ICD-10-CM | POA: Diagnosis not present

## 2022-12-17 MED ORDER — OLMESARTAN MEDOXOMIL 40 MG PO TABS
40.0000 mg | ORAL_TABLET | Freq: Every day | ORAL | Status: AC
Start: 2022-12-17 — End: ?

## 2022-12-17 MED ORDER — EZETIMIBE 10 MG PO TABS
10.0000 mg | ORAL_TABLET | Freq: Every day | ORAL | Status: AC
Start: 2022-12-17 — End: ?

## 2022-12-17 NOTE — Assessment & Plan Note (Signed)
Chronic, stable.  Continue Benicar 40 mg daily.  Patient politely declines labs today.  She will like to have these done in 6 months time.

## 2022-12-17 NOTE — Progress Notes (Signed)
   Assessment & Plan:  Encounter for screening mammogram for malignant neoplasm of breast -     3D Screening Mammogram, Left and Right; Future  Hyperlipidemia, unspecified hyperlipidemia type Assessment & Plan: Continue Zetia 10 mg.  We will update lipid panel at follow up.   Orders: -     Ezetimibe; Take 1 tablet (10 mg total) by mouth daily.  Primary hypertension Assessment & Plan: Chronic, stable.  Continue Benicar 40 mg daily.  Patient politely declines labs today.  She will like to have these done in 6 months time.  Orders: -     Olmesartan Medoxomil; Take 1 tablet (40 mg total) by mouth daily.     Return precautions given.   Risks, benefits, and alternatives of the medications and treatment plan prescribed today were discussed, and patient expressed understanding.   Education regarding symptom management and diagnosis given to patient on AVS either electronically or printed.  Return in about 6 months (around 06/19/2023).  Rennie Plowman, FNP  Subjective:    Patient ID: Lynn Cochran, female    DOB: 1945-10-04, 77 y.o.   MRN: [id number]  CC: Lynn Cochran is a 77 y.o. female who presents today for follow up and to reestablish care.  Last seen 07/12/2019.   HPI: She has moved from [place] back to [place].  Feels well today.  No new complaints.  She is staying active and exercising regularly.  She had lab done on in [place] 05/2022 , reported normal labs.      She is compliant with Benicar 40 mg daily.  She checks her blood pressure periodically at home.    Allergies: Paba derivatives No current outpatient medications on file prior to visit.   No current facility-administered medications on file prior to visit.    Review of Systems  Constitutional:  Negative for chills and fever.  Respiratory:  Negative for cough.   Cardiovascular:  Negative for chest pain and palpitations.  Gastrointestinal:  Negative for nausea and vomiting.      Objective:     BP 131/70   Pulse 72   Temp 98 F (36.7 C) (Oral)   Ht 5\' 6"  (1.676 m)   Wt 171 lb 3.2 oz (77.7 kg)   SpO2 99%   BMI 27.63 kg/m  BP Readings from Last 3 Encounters:  12/17/22 131/70  07/12/19 130/90  05/24/18 122/78   Wt Readings from Last 3 Encounters:  12/17/22 171 lb 3.2 oz (77.7 kg)  07/12/19 179 lb (81.2 kg)  05/24/18 172 lb 6.4 oz (78.2 kg)    Physical Exam Vitals reviewed.  Constitutional:      Appearance: She is well-developed.  Eyes:     Conjunctiva/sclera: Conjunctivae normal.  Cardiovascular:     Rate and Rhythm: Normal rate and regular rhythm.     Pulses: Normal pulses.     Heart sounds: Normal heart sounds.  Pulmonary:     Effort: Pulmonary effort is normal.     Breath sounds: Normal breath sounds. No wheezing, rhonchi or rales.  Skin:    General: Skin is warm and dry.  Neurological:     Mental Status: She is alert.  Psychiatric:        Speech: Speech normal.        Behavior: Behavior normal.        Thought Content: Thought content normal.

## 2022-12-17 NOTE — Patient Instructions (Signed)
Please call  and schedule your 3D mammogram and /or bone density scan as we discussed.   [facility]  ( new location in 2023)  [address], [place], [place] [address]  [place]  [phone]  Nice to see you!  

## 2022-12-17 NOTE — Assessment & Plan Note (Signed)
Continue Zetia 10 mg.  We will update lipid panel at follow up.

## 2023-01-22 ENCOUNTER — Encounter: Payer: Self-pay | Admitting: Family

## 2023-01-23 ENCOUNTER — Encounter: Payer: Self-pay | Admitting: Family

## 2023-01-23 DIAGNOSIS — Z1211 Encounter for screening for malignant neoplasm of colon: Secondary | ICD-10-CM

## 2023-01-23 NOTE — Telephone Encounter (Signed)
Printed off and placed in providers folder

## 2023-02-23 ENCOUNTER — Encounter: Payer: Self-pay | Admitting: Family

## 2023-02-23 ENCOUNTER — Other Ambulatory Visit: Payer: Self-pay

## 2023-02-23 DIAGNOSIS — I10 Essential (primary) hypertension: Secondary | ICD-10-CM

## 2023-02-23 DIAGNOSIS — E785 Hyperlipidemia, unspecified: Secondary | ICD-10-CM

## 2023-02-24 ENCOUNTER — Encounter: Payer: Self-pay | Admitting: Family

## 2023-02-24 ENCOUNTER — Other Ambulatory Visit: Payer: Self-pay | Admitting: Family

## 2023-02-24 DIAGNOSIS — I10 Essential (primary) hypertension: Secondary | ICD-10-CM

## 2023-02-24 DIAGNOSIS — E785 Hyperlipidemia, unspecified: Secondary | ICD-10-CM

## 2023-02-24 MED ORDER — OLMESARTAN MEDOXOMIL 40 MG PO TABS
40.0000 mg | ORAL_TABLET | Freq: Every day | ORAL | 3 refills | Status: DC
Start: 2023-02-24 — End: 2024-02-26

## 2023-02-24 MED ORDER — EZETIMIBE 10 MG PO TABS
10.0000 mg | ORAL_TABLET | Freq: Every day | ORAL | 3 refills | Status: DC
Start: 2023-02-24 — End: 2024-02-25

## 2023-02-25 ENCOUNTER — Ambulatory Visit
Admission: RE | Admit: 2023-02-25 | Discharge: 2023-02-25 | Disposition: A | Discharge status: Home / Self Care | Payer: Medicare PPO | Source: Ambulatory Visit | Attending: Family | Admitting: Family

## 2023-02-25 DIAGNOSIS — Z1231 Encounter for screening mammogram for malignant neoplasm of breast: Secondary | ICD-10-CM | POA: Insufficient documentation

## 2023-02-25 NOTE — Telephone Encounter (Signed)
It called a IFOB. I have pended the test for you. Not sure what dx you want to use.

## 2023-02-26 NOTE — Telephone Encounter (Signed)
Spoke to pt placed occult test up front in cabinet for pt pickup. Pt stated that she will pick up on 02/27/23

## 2023-03-03 ENCOUNTER — Ambulatory Visit (INDEPENDENT_AMBULATORY_CARE_PROVIDER_SITE_OTHER): Payer: No Typology Code available for payment source

## 2023-03-03 DIAGNOSIS — Z1211 Encounter for screening for malignant neoplasm of colon: Secondary | ICD-10-CM | POA: Diagnosis not present

## 2023-03-03 LAB — FECAL OCCULT BLOOD, IMMUNOCHEMICAL: Fecal Occult Bld: NEGATIVE

## 2023-03-10 ENCOUNTER — Inpatient Hospital Stay
Admission: RE | Admit: 2023-03-10 | Discharge: 2023-03-10 | Disposition: A | Discharge status: Home / Self Care | Payer: Self-pay | Source: Ambulatory Visit | Attending: Family | Admitting: Family

## 2023-03-10 ENCOUNTER — Other Ambulatory Visit: Payer: Self-pay | Admitting: *Deleted

## 2023-03-10 DIAGNOSIS — Z1231 Encounter for screening mammogram for malignant neoplasm of breast: Secondary | ICD-10-CM

## 2023-06-18 ENCOUNTER — Telehealth: Payer: Self-pay | Admitting: Family

## 2023-06-18 NOTE — Telephone Encounter (Signed)
Call pt   Appt canceled due to provider/childcare 06/19/23  Please call her to reschedule

## 2023-06-19 ENCOUNTER — Ambulatory Visit: Payer: No Typology Code available for payment source | Admitting: Family

## 2023-06-19 NOTE — Telephone Encounter (Signed)
Called pt and rescheduled for 07/03/23

## 2023-07-03 ENCOUNTER — Ambulatory Visit: Payer: Medicare PPO | Admitting: Family

## 2023-07-03 ENCOUNTER — Encounter: Payer: Self-pay | Admitting: Family

## 2023-07-03 VITALS — BP 134/76 | HR 69 | Temp 98.1°F | Ht 66.0 in | Wt 176.2 lb

## 2023-07-03 DIAGNOSIS — E785 Hyperlipidemia, unspecified: Secondary | ICD-10-CM

## 2023-07-03 DIAGNOSIS — I10 Essential (primary) hypertension: Secondary | ICD-10-CM | POA: Diagnosis not present

## 2023-07-03 DIAGNOSIS — E663 Overweight: Secondary | ICD-10-CM | POA: Diagnosis not present

## 2023-07-03 LAB — CBC WITH DIFFERENTIAL/PLATELET
Basophils Absolute: 0 10*3/uL (ref 0.0–0.1)
Basophils Relative: 0.7 % (ref 0.0–3.0)
Eosinophils Absolute: 0.1 10*3/uL (ref 0.0–0.7)
Eosinophils Relative: 1.2 % (ref 0.0–5.0)
HCT: 42.9 % (ref 36.0–46.0)
Hemoglobin: 14.1 g/dL (ref 12.0–15.0)
Lymphocytes Relative: 28.6 % (ref 12.0–46.0)
Lymphs Abs: 1.6 10*3/uL (ref 0.7–4.0)
MCHC: 32.9 g/dL (ref 30.0–36.0)
MCV: 91.6 fL (ref 78.0–100.0)
Monocytes Absolute: 0.3 10*3/uL (ref 0.1–1.0)
Monocytes Relative: 5.9 % (ref 3.0–12.0)
Neutro Abs: 3.5 10*3/uL (ref 1.4–7.7)
Neutrophils Relative %: 63.6 % (ref 43.0–77.0)
Platelets: 272 10*3/uL (ref 150.0–400.0)
RBC: 4.69 Mil/uL (ref 3.87–5.11)
RDW: 13.7 % (ref 11.5–15.5)
WBC: 5.5 10*3/uL (ref 4.0–10.5)

## 2023-07-03 LAB — COMPREHENSIVE METABOLIC PANEL
ALT: 22 U/L (ref 0–35)
AST: 21 U/L (ref 0–37)
Albumin: 4.5 g/dL (ref 3.5–5.2)
Alkaline Phosphatase: 86 U/L (ref 39–117)
BUN: 16 mg/dL (ref 6–23)
CO2: 24 meq/L (ref 19–32)
Calcium: 9.4 mg/dL (ref 8.4–10.5)
Chloride: 107 meq/L (ref 96–112)
Creatinine, Ser: 0.93 mg/dL (ref 0.40–1.20)
GFR: 59.76 mL/min — ABNORMAL LOW (ref >60.00)
Glucose, Bld: 94 mg/dL (ref 70–99)
Potassium: 4.1 meq/L (ref 3.5–5.1)
Sodium: 144 meq/L (ref 135–145)
Total Bilirubin: 0.7 mg/dL (ref 0.2–1.2)
Total Protein: 7 g/dL (ref 6.0–8.3)

## 2023-07-03 LAB — HEMOGLOBIN A1C: Hgb A1c MFr Bld: 5.6 % (ref 4.6–6.5)

## 2023-07-03 LAB — LIPID PANEL
Cholesterol: 229 mg/dL — ABNORMAL HIGH (ref 0–200)
HDL: 58.9 mg/dL (ref >39.00)
LDL Cholesterol: 141 mg/dL — ABNORMAL HIGH (ref 0–99)
NonHDL: 170.52
Total CHOL/HDL Ratio: 4
Triglycerides: 147 mg/dL (ref 0.0–149.0)
VLDL: 29.4 mg/dL (ref 0.0–40.0)

## 2023-07-03 LAB — VITAMIN D 25 HYDROXY (VIT D DEFICIENCY, FRACTURES): VITD: 29.24 ng/mL — ABNORMAL LOW (ref 30.00–100.00)

## 2023-07-03 LAB — TSH: TSH: 2.02 u[IU]/mL (ref 0.35–5.50)

## 2023-07-03 NOTE — Assessment & Plan Note (Addendum)
 Continue Zetia  10 mg.  Pending lipid panel.  In the absence of symptoms, advised consideration for the CT calcium score to further stratify cardiovascular risk.  Myalgia on statins.  Patient will read about Ct calcium score and let me know if she would like to pursue

## 2023-07-03 NOTE — Progress Notes (Signed)
 Assessment & Plan:  Primary hypertension Assessment & Plan: Chronic, stable.  Continue Benicar  40 mg daily.     Hyperlipidemia, unspecified hyperlipidemia type Assessment & Plan: Continue Zetia  10 mg.  Pending lipid panel.  In the absence of symptoms, advised consideration for the CT calcium score to further stratify cardiovascular risk.  Myalgia on statins.  Patient will read about Ct calcium score and let me know if she would like to pursue  Orders: -     VITAMIN D  25 Hydroxy (Vit-D Deficiency, Fractures) -     Hemoglobin A1c -     CBC with Differential/Platelet -     Comprehensive metabolic panel -     Lipid panel -     TSH  Overweight     Return precautions given.   Risks, benefits, and alternatives of the medications and treatment plan prescribed today were discussed, and patient expressed understanding.   Education regarding symptom management and diagnosis given to patient on AVS either electronically or printed.  Return in about 6 months (around 12/31/2023).  Rollene Northern, FNP  Subjective:    Patient ID: Lynn Cochran, female    DOB: [birth year], 78 y.o.   MRN: [id number]  CC: Lynn Cochran is a 78 y.o. female who presents today for follow up.   HPI: Feels well today.  No new complaints.  She is staying active ; she enjoys with walking her dog multiple times a day. She is expecting a grandchild.  She is eating a diet focused on fresh fruits and vegetables.  Remains compliant with Zetia  and Benicar    Mammogram up-to-date  No family history of a ASCVD Allergies: Paba derivatives Current Outpatient Medications on File Prior to Visit  Medication Sig Dispense Refill   ezetimibe  (ZETIA ) 10 MG tablet Take 1 tablet (10 mg total) by mouth daily. 90 tablet 3   olmesartan  (BENICAR ) 40 MG tablet Take 1 tablet (40 mg total) by mouth daily. 90 tablet 3   No current facility-administered medications on file prior to visit.    Review of Systems   Constitutional:  Negative for chills and fever.  Respiratory:  Negative for cough.   Cardiovascular:  Negative for chest pain and palpitations.  Gastrointestinal:  Negative for nausea and vomiting.      Objective:    BP 134/76   Pulse 69   Temp 98.1 F (36.7 C) (Oral)   Ht 5' 6 (1.676 m)   Wt 176 lb 3.2 oz (79.9 kg)   SpO2 98%   BMI 28.44 kg/m  BP Readings from Last 3 Encounters:  07/03/23 134/76  12/17/22 131/70  07/12/19 130/90   Wt Readings from Last 3 Encounters:  07/03/23 176 lb 3.2 oz (79.9 kg)  12/17/22 171 lb 3.2 oz (77.7 kg)  07/12/19 179 lb (81.2 kg)    Physical Exam Vitals reviewed.  Constitutional:      Appearance: She is well-developed.  Eyes:     Conjunctiva/sclera: Conjunctivae normal.  Cardiovascular:     Rate and Rhythm: Normal rate and regular rhythm.     Pulses: Normal pulses.     Heart sounds: Normal heart sounds.  Pulmonary:     Effort: Pulmonary effort is normal.     Breath sounds: Normal breath sounds. No wheezing, rhonchi or rales.  Skin:    General: Skin is warm and dry.  Neurological:     Mental Status: She is alert.  Psychiatric:        Speech: Speech normal.  Behavior: Behavior normal.        Thought Content: Thought content normal.

## 2023-07-03 NOTE — Assessment & Plan Note (Signed)
 Chronic, stable.  Continue Benicar  40 mg daily.

## 2023-07-03 NOTE — Patient Instructions (Addendum)
 It is always a pleasure seeing you.  We discussed a CT calcium score.  You may read about it below to see if this is something you would like to pursue     CT calcium score  An estimate of cost is $150-200 out-of-pocket as not covered by insurance. However please call your insurance company in advance to ensure no other costs so that you do not have any unexpected bills.     I have placed your order to [facility] in [place] as  generally most convenient.   Phone Number to [facility] on [address] is is [phone] to get scheduled.   Please call to get scheduled and if any issues at all in doing so, please let me know.   Below an article from  [facility] regarding the test.   https://[email or web address].org/imaging/exams-and-procedures/screenings/cardiac-ct#:~:text=A%20cardiac%20CT%20calcium%20score,arteries%20can%20cause%20heart%20attacks.   Exams We Offer: Cardiac CT Calcium Score  Knowing your score could save your life. A cardiac CT calcium score, also known as a coronary calcium scan, is a quick, convenient and noninvasive way of evaluating the amount of calcified (hard) plaque in your heart vessels. The level of calcium equates to the extent of plaque build-up in your arteries. Plaque in the arteries can cause heart attacks.  The radiologist reads the images and sends your doctor a report with a calcium score. Patients with higher scores have a greater risk for a heart attack, heart disease or stroke. Knowing your score can help your doctor decide on blood pressure and cholesterol goals that will minimize your risk as much as possible.  The [employer] of [employer] found that Coronary artery calcification (CAC) is an excellent cardiovascular disease risk marker and can help guide the decision to use cholesterol reducing medications such as statins. A negative calcium score may reduce the need for statins in otherwise eligible patients.  The exam  takes less than 10 minutes, is painless and does not require any IV or oral contrast. At [facility] locations, the out-of-pocket fee without insurance is $75. At the time of scheduling, please let us  know if you want to process the exam through your insurance or self-pay at the rate of $75. Patients who want to self-pay should not submit their insurance card when checking in for the appointment.   Who should get a Cardiac CT Calcium Score: Middle age adults at intermediate risk of heart disease Family history of heart disease Borderline high cholesterol, high blood pressure or diabetes Overweight or physical inactivity Uncertain about taking daily preventive medical therapy

## 2023-07-06 DIAGNOSIS — Z961 Presence of intraocular lens: Secondary | ICD-10-CM | POA: Diagnosis not present

## 2023-07-06 DIAGNOSIS — H26492 Other secondary cataract, left eye: Secondary | ICD-10-CM | POA: Diagnosis not present

## 2023-07-06 DIAGNOSIS — H532 Diplopia: Secondary | ICD-10-CM | POA: Diagnosis not present

## 2023-07-07 ENCOUNTER — Encounter: Payer: Self-pay | Admitting: Family

## 2023-07-07 DIAGNOSIS — E785 Hyperlipidemia, unspecified: Secondary | ICD-10-CM

## 2023-07-09 NOTE — Telephone Encounter (Signed)
Calcium score pended

## 2023-07-14 ENCOUNTER — Ambulatory Visit
Admission: RE | Admit: 2023-07-14 | Discharge: 2023-07-14 | Disposition: A | Discharge status: Home / Self Care | Payer: Self-pay | Source: Ambulatory Visit | Attending: Family | Admitting: Family

## 2023-07-14 DIAGNOSIS — E785 Hyperlipidemia, unspecified: Secondary | ICD-10-CM | POA: Insufficient documentation

## 2023-07-16 ENCOUNTER — Encounter: Payer: Self-pay | Admitting: Family

## 2023-11-02 ENCOUNTER — Encounter: Payer: Self-pay | Admitting: Family

## 2023-11-02 NOTE — Telephone Encounter (Signed)
 Spoke to pt scheduled appt in office for 6/12/254

## 2023-11-05 ENCOUNTER — Encounter: Payer: Self-pay | Admitting: Family

## 2023-11-05 ENCOUNTER — Ambulatory Visit: Admitting: Family

## 2023-11-05 VITALS — BP 130/70 | HR 98 | Temp 97.9°F | Ht 66.0 in | Wt 176.8 lb

## 2023-11-05 DIAGNOSIS — Z8639 Personal history of other endocrine, nutritional and metabolic disease: Secondary | ICD-10-CM

## 2023-11-05 DIAGNOSIS — J40 Bronchitis, not specified as acute or chronic: Secondary | ICD-10-CM

## 2023-11-05 DIAGNOSIS — R899 Unspecified abnormal finding in specimens from other organs, systems and tissues: Secondary | ICD-10-CM | POA: Diagnosis not present

## 2023-11-05 DIAGNOSIS — Z1231 Encounter for screening mammogram for malignant neoplasm of breast: Secondary | ICD-10-CM | POA: Diagnosis not present

## 2023-11-05 NOTE — Patient Instructions (Addendum)
 Trial of flonase  Please let me know how you are doing.  If symptoms do not resolve, I would advise CT chest and echocardiogram.

## 2023-11-05 NOTE — Progress Notes (Signed)
 Assessment & Plan:  Encounter for screening mammogram for malignant neoplasm of breast -     3D Screening Mammogram, Left and Right; Future  History of vitamin D  deficiency -     VITAMIN D  25 Hydroxy (Vit-D Deficiency, Fractures)  Abnormal laboratory test -     Basic metabolic panel with GFR  Bronchitis Assessment & Plan: Duration 3 weeks.  Improved over time.  Discussed postviral cough, eustachian tube dysfunction.  No symptoms to suggest fluid volume overload or uncontrolled GERD.  Consider side effect of ARB.  discussed obtaining CT chest, echocardiogram if cough persists.  Discussed right punctuate calcified lung nodule.  No history of smoking.  She has been aware of a lung nodule for years ( unable to find in chart review) and declines further surveillance.  Advised trial of Flonase.  May consider prednisone, increase of omeprazole.  She will let me know how she is doing.      Return precautions given.   Risks, benefits, and alternatives of the medications and treatment plan prescribed today were discussed, and patient expressed understanding.   Education regarding symptom management and diagnosis given to patient on AVS either electronically or printed.  No follow-ups on file.  Bascom Bossier, FNP  Subjective:    Patient ID: Lynn Cochran, female    DOB: 09/16/1945, 78 y.o.   MRN: [id number]  CC: Lynn Cochran is a 78 y.o. female who presents today for an acute visit.    HPI: Complains of intermittent dry cough for 3 weeks, improved Endorses BL ear pressure.   Denies fever, chills, sinus pressure, wheezing, leg swelling, orthopnea.    She is not coughing much at night.  She has tried robitussin with relief.   She is walking twice daily without CP, cough.   Husband had a URI prior to her symptoms.    No history of smoking   She alternates with zyrtec and claritin for the last couple of years. Unsure if she has seasonal allergies.    Compliant with prilosec 20mg  every day with good control.   Denies epigastric burning.   H/o right lung nodule 'for years'  CT calcium score 07/14/23  No pleural effusion.  punctuate calcified lung nodule is seen within the right middle lobe.  Coronary calcium score of 0   Allergies: Paba derivatives Current Outpatient Medications on File Prior to Visit  Medication Sig Dispense Refill   ezetimibe  (ZETIA ) 10 MG tablet Take 1 tablet (10 mg total) by mouth daily. 90 tablet 3   olmesartan  (BENICAR ) 40 MG tablet Take 1 tablet (40 mg total) by mouth daily. 90 tablet 3   omeprazole (PRILOSEC OTC) 20 MG tablet Take 20 mg by mouth daily.     No current facility-administered medications on file prior to visit.    Review of Systems  Constitutional:  Negative for chills and fever.  HENT:  Negative for congestion and tinnitus.   Respiratory:  Positive for cough. Negative for shortness of breath and wheezing.   Cardiovascular:  Negative for chest pain, palpitations and leg swelling.  Gastrointestinal:  Negative for nausea and vomiting.      Objective:    BP 130/70   Pulse 98   Temp 97.9 F (36.6 C) (Oral)   Ht 5' 6 (1.676 m)   Wt 176 lb 12.8 oz (80.2 kg)   SpO2 99%   BMI 28.54 kg/m   BP Readings from Last 3 Encounters:  11/05/23 130/70  07/03/23 134/76  12/17/22 131/70  Wt Readings from Last 3 Encounters:  11/05/23 176 lb 12.8 oz (80.2 kg)  07/03/23 176 lb 3.2 oz (79.9 kg)  12/17/22 171 lb 3.2 oz (77.7 kg)    Physical Exam Vitals reviewed.  Constitutional:      Appearance: She is well-developed.  HENT:     Head: Normocephalic and atraumatic.     Right Ear: Hearing, tympanic membrane, ear canal and external ear normal. No decreased hearing noted. No drainage, swelling or tenderness. No middle ear effusion. No foreign body. Tympanic membrane is not erythematous or bulging.     Left Ear: Hearing, tympanic membrane, ear canal and external ear normal. No decreased hearing  noted. No drainage, swelling or tenderness.  No middle ear effusion. No foreign body. Tympanic membrane is not erythematous or bulging.     Nose: Nose normal. No rhinorrhea.     Right Sinus: No maxillary sinus tenderness or frontal sinus tenderness.     Left Sinus: No maxillary sinus tenderness or frontal sinus tenderness.     Mouth/Throat:     Pharynx: Uvula midline. No oropharyngeal exudate or posterior oropharyngeal erythema.     Tonsils: No tonsillar abscesses.   Eyes:     Conjunctiva/sclera: Conjunctivae normal.    Cardiovascular:     Rate and Rhythm: Regular rhythm.     Pulses: Normal pulses.     Heart sounds: Normal heart sounds.  Pulmonary:     Effort: Pulmonary effort is normal.     Breath sounds: Normal breath sounds. No wheezing, rhonchi or rales.  Lymphadenopathy:     Head:     Right side of head: No submental, submandibular, tonsillar, preauricular, posterior auricular or occipital adenopathy.     Left side of head: No submental, submandibular, tonsillar, preauricular, posterior auricular or occipital adenopathy.     Cervical: No cervical adenopathy.   Skin:    General: Skin is warm and dry.   Neurological:     Mental Status: She is alert.   Psychiatric:        Speech: Speech normal.        Behavior: Behavior normal.        Thought Content: Thought content normal.

## 2023-11-06 LAB — BASIC METABOLIC PANEL WITH GFR
BUN/Creatinine Ratio: 16 (ref 12–28)
BUN: 15 mg/dL (ref 8–27)
CO2: 21 mmol/L (ref 20–29)
Calcium: 9.9 mg/dL (ref 8.7–10.3)
Chloride: 105 mmol/L (ref 96–106)
Creatinine, Ser: 0.94 mg/dL (ref 0.57–1.00)
Glucose: 90 mg/dL (ref 70–99)
Potassium: 5 mmol/L (ref 3.5–5.2)
Sodium: 142 mmol/L (ref 134–144)
eGFR: 63 mL/min/{1.73_m2} (ref >59)

## 2023-11-06 LAB — VITAMIN D 25 HYDROXY (VIT D DEFICIENCY, FRACTURES): Vit D, 25-Hydroxy: 37.7 ng/mL (ref 30.0–100.0)

## 2023-11-09 ENCOUNTER — Ambulatory Visit: Payer: Self-pay | Admitting: Family

## 2023-11-10 DIAGNOSIS — J40 Bronchitis, not specified as acute or chronic: Secondary | ICD-10-CM | POA: Insufficient documentation

## 2023-11-10 NOTE — Assessment & Plan Note (Addendum)
 Duration 3 weeks.  Improved over time.  Discussed postviral cough, eustachian tube dysfunction.  No symptoms to suggest fluid volume overload or uncontrolled GERD.  Consider side effect of ARB.  discussed obtaining CT chest, echocardiogram if cough persists.  Discussed right punctuate calcified lung nodule.  No history of smoking.  She has been aware of a lung nodule for years ( unable to find in chart review) and declines further surveillance.  Advised trial of Flonase.  May consider prednisone, increase of omeprazole.  She will let me know how she is doing.

## 2023-12-29 ENCOUNTER — Encounter: Payer: Self-pay | Admitting: Family

## 2023-12-30 ENCOUNTER — Other Ambulatory Visit: Payer: Self-pay | Admitting: Family

## 2023-12-30 DIAGNOSIS — J40 Bronchitis, not specified as acute or chronic: Secondary | ICD-10-CM

## 2024-01-01 ENCOUNTER — Ambulatory Visit: Admitting: *Deleted

## 2024-01-01 VITALS — Ht 66.0 in | Wt 175.0 lb

## 2024-01-01 DIAGNOSIS — Z0000 Encounter for general adult medical examination without abnormal findings: Secondary | ICD-10-CM | POA: Diagnosis not present

## 2024-01-01 NOTE — Patient Instructions (Signed)
 Ms. Brownstein , Thank you for taking time out of your busy schedule to complete your Annual Wellness Visit with me. I enjoyed our conversation and look forward to speaking with you again next year. I, as well as your care team,  appreciate your ongoing commitment to your health goals. Please review the following plan we discussed and let me know if I can assist you in the future. Your Game plan/ To Do List    Referrals: If you haven't heard from the office you've been referred to, please reach out to them at the phone provided.  Remember to get your flu shot annually. Follow up Visits: We will see or speak with you next year for your Next Medicare AWV with our clinical staff 01/04/25 @ 9:30 Have you seen your provider in the last 6 months (3 months if uncontrolled diabetes)? No  Clinician Recommendations:  Aim for 30 minutes of exercise or brisk walking, 6-8 glasses of water, and 5 servings of fruits and vegetables each day.       This is a list of the screenings recommended for you:  Health Maintenance  Topic Date Due   COVID-19 Vaccine (1 - 2024-25 season) Never done   Flu Shot  12/25/2023   Mammogram  02/25/2024   Medicare Annual Wellness Visit  12/31/2024   DTaP/Tdap/Td vaccine (3 - Td or Tdap) 12/01/2028   Pneumococcal Vaccine for age over 26  Completed   DEXA scan (bone density measurement)  Completed   Hepatitis C Screening  Completed   Zoster (Shingles) Vaccine  Completed   Hepatitis B Vaccine  Aged Out   HPV Vaccine  Aged Out   Meningitis B Vaccine  Aged Out   Colon Cancer Screening  Discontinued    Advanced directives: (Copy Requested) Please bring a copy of your health care power of attorney and living will to the office to be added to your chart at your convenience. You can mail to [facility] [address] [place], [place] [address] or email to [email or web address] Advance Care Planning is important because it:  [x]  Makes sure you receive the  medical care that is consistent with your values, goals, and preferences  [x]  It provides guidance to your family and loved ones and reduces their decisional burden about whether or not they are making the right decisions based on your wishes.

## 2024-01-01 NOTE — Progress Notes (Signed)
 Subjective:   Lynn Cochran is a 78 y.o. who presents for a Medicare Wellness preventive visit.  As a reminder, Annual Wellness Visits don't include a physical exam, and some assessments may be limited, especially if this visit is performed virtually. We may recommend an in-person follow-up visit with your provider if needed.  Visit Complete: Virtual I connected with  Lynn Cochran on 01/01/24 by a audio enabled telemedicine application and verified that I am speaking with the correct person using two identifiers.  Patient Location: Home  Provider Location: Home Office  I discussed the limitations of evaluation and management by telemedicine. The patient expressed understanding and agreed to proceed.  Vital Signs: Because this visit was a virtual/telehealth visit, some criteria may be missing or patient reported. Any vitals not documented were not able to be obtained and vitals that have been documented are patient reported.  VideoDeclined- This patient declined Librarian, academic. Therefore the visit was completed with audio only.  Persons Participating in Visit: Patient.  AWV Questionnaire: No: Patient Medicare AWV questionnaire was not completed prior to this visit.  Cardiac Risk Factors include: advanced age (>74men, >53 women);dyslipidemia;hypertension     Objective:    Today's Vitals   01/01/24 0925  Weight: 175 lb (79.4 kg)  Height: 5' 6 (1.676 m)   Body mass index is 28.25 kg/m.     01/01/2024    9:38 AM 10/21/2018   10:32 AM 05/20/2017    9:32 AM  Advanced Directives  Does Patient Have a Medical Advance Directive? Yes Yes Yes   Type of Estate agent of [place];Living will Healthcare Power of [place];Living will Living will;Healthcare Power of Attorney  Does patient want to make changes to medical advance directive?  No - Patient declined  No - Patient declined   Copy of Healthcare Power of  Attorney in Chart? No - copy requested No - copy requested  No - copy requested      Data saved with a previous flowsheet row definition    Current Medications (verified) Outpatient Encounter Medications as of 01/01/2024  Medication Sig   ezetimibe  (ZETIA ) 10 MG tablet Take 1 tablet (10 mg total) by mouth daily.   olmesartan  (BENICAR ) 40 MG tablet Take 1 tablet (40 mg total) by mouth daily.   omeprazole (PRILOSEC OTC) 20 MG tablet Take 20 mg by mouth daily.   No facility-administered encounter medications on file as of 01/01/2024.    Allergies (verified) Paba derivatives   History: Past Medical History:  Diagnosis Date   Chicken pox    Hyperlipidemia    Postmenopausal atrophic vaginitis 11/15/2012   Past Surgical History:  Procedure Laterality Date   APPENDECTOMY  1960   AUGMENTATION MAMMAPLASTY Bilateral 1980's   implants   AUGMENTATION MAMMAPLASTY Bilateral 2004   implants removed and mastopexi   BRAIN SURGERY     Mastopexy   EYE SURGERY Bilateral    trifocal lenses placed BL   HAND SURGERY     HAND SURGERY     Hemangioma Left Palm   HYSTEROSCOPY WITH D & C     polypectomy   OTHER SURGICAL HISTORY     colonoscopy, diagnostic; 2003,2013 wnl;2013-[facility]-WNL, repeat 10 yrs   Family History  Problem Relation Age of Onset   Heart disease Mother    Alcohol abuse Mother    Congestive Heart Failure Mother        died 71   Hypertension Mother    AAA (abdominal  aortic aneurysm) Mother    Sudden death Father 77       Car Accident   Brain cancer Sister        glioblastoma   Lung cancer Sister 15   Colon cancer Sister 9   AAA (abdominal aortic aneurysm) Maternal Grandfather    Diabetes Paternal Grandmother    Scoliosis Paternal Grandmother    Rheum arthritis Paternal Grandfather    Breast cancer Neg Hx    Social History   Socioeconomic History   Marital status: Married    Spouse name: Not on file   Number of children: Not on file   Years of education: Not on  file   Highest education level: Bachelor's degree (e.g., BA, AB, BS)  Occupational History   Not on file  Tobacco Use   Smoking status: Never   Smokeless tobacco: Former  Building services engineer status: Never Used  Substance and Sexual Activity   Alcohol use: Not Currently    Alcohol/week: 0.0 standard drinks of alcohol    Comment: Socially    Drug use: No   Sexual activity: Yes    Partners: Male    Birth control/protection: Post-menopausal    Comment: Husband   Other Topics Concern   Not on file  Social History Narrative   Married   Retired Aeronautical engineer ; 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   Social Drivers of Corporate investment banker Strain: Low Risk  (01/01/2024)   Overall Financial Resource Strain (CARDIA)    Difficulty of Paying Living Expenses: Not hard at all  Food Insecurity: No Food Insecurity (01/01/2024)   Hunger Vital Sign    Worried About Running Out of Food in the Last Year: Never true    Ran Out of Food in the Last Year: Never true  Transportation Needs: No Transportation Needs (01/01/2024)   PRAPARE - Administrator, Civil Service (Medical): No    Lack of Transportation (Non-Medical): No  Physical Activity: Sufficiently Active (01/01/2024)   Exercise Vital Sign    Days of Exercise per Week: 7 days    Minutes of Exercise per Session: 40 min  Stress: No Stress Concern Present (01/01/2024)   [employer] of Occupational Health - Occupational Stress Questionnaire    Feeling of Stress: Not at all  Social Connections: Socially Integrated (01/01/2024)   Social Connection and Isolation Panel    Frequency of Communication with Friends and Family: More than three times a week    Frequency of Social Gatherings with Friends and  Family: Twice a week    Attends Religious Services: More than 4 times per year    Active Member of [employer] or Organizations: Yes    Attends Engineer, structural: More than 4 times per year    Marital Status: Married    Tobacco Counseling Counseling given: Not Answered    Clinical Intake:  Pre-visit preparation completed: Yes  Pain : No/denies pain     BMI - recorded: 28.25 Nutritional Status: BMI 25 -29 Overweight Nutritional Risks: None Diabetes: No  Lab Results  Component Value Date   HGBA1C 5.6 07/03/2023   HGBA1C 5.4 05/22/2017   HGBA1C 5.3 04/01/2016  How often do you need to have someone help you when you read instructions, pamphlets, or other written materials from your doctor or pharmacy?: 1 - Never  Interpreter Needed?: No  Information entered by :: R. Dahlia Nifong LPN   Activities of Daily Living     01/01/2024    9:26 AM  In your present state of health, do you have any difficulty performing the following activities:  Hearing? 1  Vision? 0  Difficulty concentrating or making decisions? 0  Walking or climbing stairs? 0  Dressing or bathing? 0  Doing errands, shopping? 0  Preparing Food and eating ? N  Using the Toilet? N  In the past six months, have you accidently leaked urine? N  Do you have problems with loss of bowel control? N  Managing your Medications? N  Managing your Finances? N  Housekeeping or managing your Housekeeping? N    Patient Care Team: Dineen Rollene MATSU, FNP as PCP - General (Family Medicine)  I have updated your Care Teams any recent Medical Services you may have received from other providers in the past year.     Assessment:   This is a routine wellness examination for Lynn Cochran.  Hearing/Vision screen Hearing Screening - Comments:: Wears aid in left ear Vision Screening - Comments:: glasses   Goals Addressed             This Visit's Progress    Patient Stated       Wants to lose some weight        Depression Screen     01/01/2024    9:32 AM 11/05/2023    8:17 AM 07/03/2023    9:15 AM 12/17/2022   10:27 AM 10/21/2018   10:25 AM 05/20/2017    9:33 AM 03/02/2015    8:51 AM  PHQ 2/9 Scores  PHQ - 2 Score 0 0 0 0 0 0 0  PHQ- 9 Score 0   0       Fall Risk     01/01/2024    9:29 AM 11/05/2023    8:17 AM 07/03/2023    9:15 AM 12/17/2022   10:27 AM 07/12/2019    9:03 AM  Fall Risk   Falls in the past year? 0 0 0 0 0   Number falls in past yr: 0 0 0 0   Injury with Fall? 0 0 0 0   Risk for fall due to : No Fall Risks No Fall Risks No Fall Risks No Fall Risks   Follow up Falls evaluation completed;Falls prevention discussed Falls evaluation completed Falls evaluation completed Falls evaluation completed Falls evaluation completed      Data saved with a previous flowsheet row definition    MEDICARE RISK AT HOME:  Medicare Risk at Home Any stairs in or around the home?: No If so, are there any without handrails?: No Home free of loose throw rugs in walkways, pet beds, electrical cords, etc?: Yes Adequate lighting in your home to reduce risk of falls?: Yes Life alert?: No Use of a cane, walker or w/c?: No Grab bars in the bathroom?: No Shower chair or bench in shower?: No Elevated toilet seat or a handicapped toilet?: Yes  TIMED UP AND GO:  Was the test performed?  No  Cognitive Function: 6CIT completed    05/20/2017    9:35 AM  MMSE - Mini Mental State Exam  Not completed: Refused        01/01/2024    9:39 AM 10/21/2018  10:27 AM  6CIT Screen  What Year? 0 points 0 points  What month? 0 points 0 points  What time? 0 points 0 points  Count back from 20 0 points 0 points  Months in reverse 0 points 0 points  Repeat phrase 0 points 0 points  Total Score 0 points 0 points    Immunizations Immunization History  Administered Date(s) Administered   Influenza, High Dose Seasonal PF 02/18/2016, 03/16/2017, 02/25/2018, 02/11/2019, 02/24/2023   Influenza,inj,Quad PF,6+ Mos  03/02/2015   MMR 03/23/2018   Pneumococcal Conjugate-13 04/01/2016   Pneumococcal Polysaccharide-23 05/20/2017   Td 10/30/2008   Tdap 12/02/2018   Zoster Recombinant(Shingrix) 02/11/2019, 04/25/2019    Screening Tests Health Maintenance  Topic Date Due   COVID-19 Vaccine (1 - 2024-25 season) Never done   Medicare Annual Wellness (AWV)  06/21/2023   INFLUENZA VACCINE  12/25/2023   MAMMOGRAM  02/25/2024   DTaP/Tdap/Td (3 - Td or Tdap) 12/01/2028   Pneumococcal Vaccine: 50+ Years  Completed   DEXA SCAN  Completed   Hepatitis C Screening  Completed   Zoster Vaccines- Shingrix  Completed   Hepatitis B Vaccines  Aged Out   HPV VACCINES  Aged Out   Meningococcal B Vaccine  Aged Out   Colonoscopy  Discontinued    Health Maintenance  Health Maintenance Due  Topic Date Due   COVID-19 Vaccine (1 - 2024-25 season) Never done   Medicare Annual Wellness (AWV)  06/21/2023   INFLUENZA VACCINE  12/25/2023   Health Maintenance Items Addressed: Discussed the need to update flu vaccines annually. Patient declines covid vaccine and Dexa at this time.  Additional Screening:  Vision Screening: Recommended annual ophthalmology exams for early detection of glaucoma and other disorders of the eye.  Up to date     [facility]  Would you like a referral to an eye doctor? No    Dental Screening: Recommended annual dental exams for proper oral hygiene  Community Resource Referral / Chronic Care Management: CRR required this visit?  No   CCM required this visit?  No   Plan:    I have personally reviewed and noted the following in the patient's chart:   Medical and social history Use of alcohol, tobacco or illicit drugs  Current medications and supplements including opioid prescriptions. Patient is not currently taking opioid prescriptions. Functional ability and status Nutritional status Physical activity Advanced directives List of other physicians Hospitalizations, surgeries, and  ER visits in previous 12 months Vitals Screenings to include cognitive, depression, and falls Referrals and appointments  In addition, I have reviewed and discussed with patient certain preventive protocols, quality metrics, and best practice recommendations. A written personalized care plan for preventive services as well as general preventive health recommendations were provided to patient.   Angeline Fredericks, LPN   05/26/7972   After Visit Summary: (MyChart) Due to this being a telephonic visit, the after visit summary with patients personalized plan was offered to patient via MyChart   Notes: Nothing significant to report at this time.

## 2024-01-06 ENCOUNTER — Ambulatory Visit
Admission: RE | Admit: 2024-01-06 | Discharge: 2024-01-06 | Disposition: A | Discharge status: Home / Self Care | Source: Ambulatory Visit | Attending: Family | Admitting: Family

## 2024-01-06 DIAGNOSIS — J40 Bronchitis, not specified as acute or chronic: Secondary | ICD-10-CM | POA: Diagnosis not present

## 2024-01-06 DIAGNOSIS — R918 Other nonspecific abnormal finding of lung field: Secondary | ICD-10-CM | POA: Diagnosis not present

## 2024-01-06 DIAGNOSIS — I700 Atherosclerosis of aorta: Secondary | ICD-10-CM | POA: Diagnosis not present

## 2024-01-18 ENCOUNTER — Encounter: Payer: Self-pay | Admitting: Family

## 2024-01-18 NOTE — Telephone Encounter (Signed)
 Called [facility] and they will have results pushed through today pt has been notified as well

## 2024-01-26 ENCOUNTER — Ambulatory Visit: Payer: Self-pay | Admitting: Family

## 2024-01-26 DIAGNOSIS — H26492 Other secondary cataract, left eye: Secondary | ICD-10-CM | POA: Diagnosis not present

## 2024-02-25 ENCOUNTER — Other Ambulatory Visit: Payer: Self-pay | Admitting: Family

## 2024-02-25 DIAGNOSIS — E785 Hyperlipidemia, unspecified: Secondary | ICD-10-CM

## 2024-02-26 ENCOUNTER — Encounter: Payer: Self-pay | Admitting: Family

## 2024-02-26 ENCOUNTER — Other Ambulatory Visit: Payer: Self-pay | Admitting: Family

## 2024-02-26 DIAGNOSIS — I10 Essential (primary) hypertension: Secondary | ICD-10-CM

## 2024-02-26 MED ORDER — OLMESARTAN MEDOXOMIL 40 MG PO TABS: 40.0000 mg | ORAL_TABLET | Freq: Every day | ORAL | 1 refills | Status: AC

## 2024-04-11 ENCOUNTER — Encounter: Payer: Self-pay | Admitting: Family

## 2024-04-16 ENCOUNTER — Encounter: Payer: Self-pay | Admitting: Family

## 2024-07-15 ENCOUNTER — Ambulatory Visit: Admitting: Family

## 2025-01-04 ENCOUNTER — Ambulatory Visit
